# Patient Record
Sex: Female | Born: 1960 | Race: White | Hispanic: No | Smoking: Former smoker
Health system: Southern US, Community
[De-identification: ages and names within clinical notes are randomized; demographics above are authoritative.]

## PROBLEM LIST (undated history)

## (undated) DIAGNOSIS — M51369 Other intervertebral disc degeneration, lumbar region without mention of lumbar back pain or lower extremity pain: Secondary | ICD-10-CM

## (undated) DIAGNOSIS — C801 Malignant (primary) neoplasm, unspecified: Secondary | ICD-10-CM

## (undated) DIAGNOSIS — M5136 Other intervertebral disc degeneration, lumbar region: Secondary | ICD-10-CM

## (undated) DIAGNOSIS — M1990 Unspecified osteoarthritis, unspecified site: Secondary | ICD-10-CM

## (undated) HISTORY — DX: Other intervertebral disc degeneration, lumbar region without mention of lumbar back pain or lower extremity pain: M51.369

## (undated) HISTORY — DX: Unspecified osteoarthritis, unspecified site: M19.90

## (undated) HISTORY — DX: Malignant (primary) neoplasm, unspecified: C80.1

## (undated) HISTORY — PX: ABDOMINAL HYSTERECTOMY: SHX81

## (undated) HISTORY — PX: APPENDECTOMY: SHX54

## (undated) HISTORY — PX: HIP SURGERY: SHX245

## (undated) HISTORY — DX: Other intervertebral disc degeneration, lumbar region: M51.36

---

## 2013-09-08 HISTORY — PX: BACK SURGERY: SHX140

## 2018-07-16 ENCOUNTER — Ambulatory Visit (INDEPENDENT_AMBULATORY_CARE_PROVIDER_SITE_OTHER): Payer: Medicare HMO | Admitting: Orthopaedic Surgery

## 2018-07-16 ENCOUNTER — Encounter (INDEPENDENT_AMBULATORY_CARE_PROVIDER_SITE_OTHER): Payer: Self-pay | Admitting: Orthopaedic Surgery

## 2018-07-16 ENCOUNTER — Ambulatory Visit (INDEPENDENT_AMBULATORY_CARE_PROVIDER_SITE_OTHER): Payer: Medicare HMO

## 2018-07-16 VITALS — BP 163/102 | HR 69

## 2018-07-16 DIAGNOSIS — G8929 Other chronic pain: Secondary | ICD-10-CM | POA: Diagnosis not present

## 2018-07-16 DIAGNOSIS — M25511 Pain in right shoulder: Secondary | ICD-10-CM | POA: Diagnosis not present
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
## 2018-07-19 ENCOUNTER — Ambulatory Visit (INDEPENDENT_AMBULATORY_CARE_PROVIDER_SITE_OTHER): Payer: Self-pay | Admitting: Orthopaedic Surgery

## 2018-07-20 ENCOUNTER — Telehealth (INDEPENDENT_AMBULATORY_CARE_PROVIDER_SITE_OTHER): Payer: Self-pay | Admitting: Orthopaedic Surgery

## 2018-07-20 ENCOUNTER — Other Ambulatory Visit: Payer: Self-pay | Admitting: Radiology

## 2018-07-20 MED ORDER — DIAZEPAM 5 MG PO TABS: ORAL_TABLET | ORAL | 0 refills | Status: DC

## 2018-07-20 NOTE — Telephone Encounter (Signed)
Valium 5mg  1 tab po prior to scan

## 2018-07-20 NOTE — Telephone Encounter (Signed)
Please advise 

## 2018-07-20 NOTE — Telephone Encounter (Signed)
Patient requesting a rx for medication to help her get thru MRI, even if its open. Patient is very claustrophobia. [facility] [address].

## 2018-07-20 NOTE — Telephone Encounter (Signed)
CALLED [facility] INTO PH[facility]Y AND NOTIFIED PT

## 2018-07-29 ENCOUNTER — Ambulatory Visit
Admission: RE | Admit: 2018-07-29 | Discharge: 2018-07-29 | Disposition: A | Discharge status: Home / Self Care | Payer: Medicare HMO | Source: Ambulatory Visit | Attending: Orthopaedic Surgery | Admitting: Orthopaedic Surgery

## 2018-07-29 DIAGNOSIS — G8929 Other chronic pain: Secondary | ICD-10-CM

## 2018-07-29 DIAGNOSIS — M25511 Pain in right shoulder: Principal | ICD-10-CM

## 2018-07-29 MED ORDER — IOPAMIDOL (ISOVUE-M 200) INJECTION 41%
15.0000 mL | Freq: Once | INTRAMUSCULAR | Status: AC
  Administered 2018-07-29: 15 mL via INTRA_ARTICULAR

## 2018-07-30 ENCOUNTER — Ambulatory Visit (INDEPENDENT_AMBULATORY_CARE_PROVIDER_SITE_OTHER): Payer: Medicare HMO | Admitting: Orthopaedic Surgery

## 2018-08-03 ENCOUNTER — Other Ambulatory Visit (INDEPENDENT_AMBULATORY_CARE_PROVIDER_SITE_OTHER): Payer: Self-pay | Admitting: *Deleted

## 2018-08-03 ENCOUNTER — Ambulatory Visit (INDEPENDENT_AMBULATORY_CARE_PROVIDER_SITE_OTHER): Payer: Medicare HMO | Admitting: Orthopaedic Surgery

## 2018-08-03 ENCOUNTER — Encounter (INDEPENDENT_AMBULATORY_CARE_PROVIDER_SITE_OTHER): Payer: Self-pay | Admitting: Orthopaedic Surgery

## 2018-08-03 VITALS — BP 125/87 | HR 87 | Resp 16 | Ht 62.0 in | Wt 240.0 lb

## 2018-08-03 DIAGNOSIS — G8929 Other chronic pain: Secondary | ICD-10-CM

## 2018-08-03 DIAGNOSIS — M25511 Pain in right shoulder: Secondary | ICD-10-CM | POA: Diagnosis not present

## 2018-08-03 NOTE — Progress Notes (Signed)
Office Visit Note   Patient: Shannon Delacruz           Date of Birth: April 05, 1961           MRN: [id number] Visit Date: 08/03/2018              Requested by: Neale Burly, [place] [place] [address] [place], [place] [address] PCP: Neale Burly, MD   Assessment & Plan: Visit Diagnoses:  1. Chronic right shoulder pain     Plan: MRI scan demonstrates moderate supraspinatus and infraspinatus tendinosis with a focal full-thickness tear of the distal supraspinatus.  No focal muscular atrophy.  Biceps intact but with moderate intra-articular tendinosis.  Normal AC joint.  Acromium is type I.  Possible [place] complex.  There is a possible tear of the superior.  Long discussion  regarding the above.  Shannon Delacruz is going out of town for almost a month and would like to try physical therapy before considering surgery.  We will schedule that to see her back in January.  Discussed surgery to include an arthroscopic SCD debride the joint possible biceps tenodesis and rotator cuff tear repair  Follow-Up Instructions: Return in about 1 month (around 09/02/2018).   Orders:  No orders of the defined types were placed in this encounter.  No orders of the defined types were placed in this encounter.     Procedures: No procedures performed   Clinical Data: No additional findings.   Subjective: Chief Complaint  Patient presents with  . Right Shoulder - Follow-up  . Results    MRI results  Symptoms have not changed.  Still having pain with overhead motion and even some referred pain into the biceps.  MRI scan findings as above  HPI  Review of Systems  Constitutional: Positive for fatigue.  HENT: Positive for trouble swallowing.   Eyes: Negative for pain.  Respiratory: Negative for shortness of breath.   Cardiovascular: Positive for leg swelling.  Gastrointestinal: Negative for constipation.  Endocrine: Positive for heat intolerance.  Genitourinary: Negative for difficulty  urinating.  Musculoskeletal: Positive for joint swelling, neck pain and neck stiffness.  Skin: Negative for rash.  Allergic/Immunologic: Negative for food allergies.  Neurological: Positive for numbness.  Hematological: Does not bruise/bleed easily.  Psychiatric/Behavioral: Positive for sleep disturbance.     Objective: Vital Signs: BP 125/87 (BP Location: Left Arm, Patient Position: Sitting, Cuff Size: Normal)   Pulse 87   Resp 16   Ht 5\' 2"  (1.575 m)   Wt 240 lb (108.9 kg)   BMI 43.90 kg/m   Physical Exam  Constitutional: She is oriented to person, place, and time. She appears well-developed and well-nourished.  HENT:  Mouth/Throat: Oropharynx is clear and moist.  Eyes: Pupils are equal, round, and reactive to light. EOM are normal.  Pulmonary/Chest: Effort normal.  Neurological: She is alert and oriented to person, place, and time.  Skin: Skin is warm and dry.  Psychiatric: She has a normal mood and affect. Her behavior is normal.    Ortho Exam awake alert and oriented x3.  Comfortable sitting.  Right shoulder is a little tight at the extreme of internal rotation and flexion but I could not place her arm fully overhead passively.  Positive speeds sign.  Mildly positive impingement testing and empty can.  Good strength.  Skin intact.  Some tenderness over the anterior aspect of the shoulder and the biceps tendon  Specialty Comments:  No specialty comments available.  Imaging: No results found.   [place]  History: There are no active problems to display for this patient.  Past Medical History:  Diagnosis Date  . Cancer ([place])    UTERINE  . DDD (degenerative disc disease), lumbar   . Osteoarthritis     History reviewed. No pertinent family history.  Past Surgical History:  Procedure Laterality Date  . ABDOMINAL HYSTERECTOMY    . APPENDECTOMY    . BACK SURGERY  2015  . HIP SURGERY     Social History   Occupational History  . Not on file  Tobacco Use  . Smoking  status: Former Smoker    Packs/day: 1.00    Years: 10.00    Pack years: 10.00    Types: Cigarettes    Last attempt to quit: 08/03/1993    Years since quitting: 25.0  . Smokeless tobacco: Never Used  Substance and Sexual Activity  . Alcohol use: Yes    Comment: OCC  . Drug use: Not Currently  . Sexual activity: Not on file

## 2018-08-10 ENCOUNTER — Other Ambulatory Visit (INDEPENDENT_AMBULATORY_CARE_PROVIDER_SITE_OTHER): Payer: Self-pay | Admitting: *Deleted

## 2018-08-10 ENCOUNTER — Telehealth (INDEPENDENT_AMBULATORY_CARE_PROVIDER_SITE_OTHER): Payer: Self-pay | Admitting: Orthopaedic Surgery

## 2018-08-10 MED ORDER — DICLOFENAC SODIUM 1 % TD GEL: TRANSDERMAL | 4 refills | Status: DC

## 2018-08-10 NOTE — Telephone Encounter (Signed)
Patient states at her last office visit with Dr. Durward Fortes on 08/03/18 he discussed Voltaren Gel.  Patient states she would [facility] [address] in [place].

## 2018-08-10 NOTE — Telephone Encounter (Signed)
OK to prescribe, do not anything in last note. Thank you.

## 2018-08-10 NOTE — Telephone Encounter (Signed)
OK to prescribe, do not see anything in last note

## 2018-08-10 NOTE — Telephone Encounter (Signed)
ok 

## 2018-08-17 ENCOUNTER — Ambulatory Visit: Payer: Medicare HMO | Attending: Orthopaedic Surgery | Admitting: Physical Therapy

## 2018-08-17 ENCOUNTER — Encounter: Payer: Self-pay | Admitting: Physical Therapy

## 2018-08-17 DIAGNOSIS — M6281 Muscle weakness (generalized): Secondary | ICD-10-CM | POA: Diagnosis present

## 2018-08-17 DIAGNOSIS — G8929 Other chronic pain: Secondary | ICD-10-CM

## 2018-08-17 DIAGNOSIS — M25511 Pain in right shoulder: Secondary | ICD-10-CM | POA: Insufficient documentation

## 2018-08-17 NOTE — Therapy (Signed)
[facility] [address]  [place], [place], [address] Phone: [phone]   Fax:  [phone]  Physical Therapy Evaluation  Patient Details  Name: Ginelle Bays MRN: [id number] Date of Birth: 1960/10/08 Referring Provider (PT): Joni Fears MD   Encounter Date: 08/17/2018  PT End of Session - 08/17/18 1117    Visit Number  1    Number of Visits  16    Date for PT Re-Evaluation  10/12/18    Authorization Type  [employer] MCR and MCD    PT Start Time  1000    PT Stop Time  1100    PT Time Calculation (min)  60 min    Equipment Utilized During Treatment  Gait belt    Activity Tolerance  Patient tolerated treatment well    Behavior During Therapy  [facility] for tasks assessed/performed       Past Medical History:  Diagnosis Date  . Cancer ([place])    UTERINE  . DDD (degenerative disc disease), lumbar   . Osteoarthritis     Past Surgical History:  Procedure Laterality Date  . ABDOMINAL HYSTERECTOMY    . APPENDECTOMY    . BACK SURGERY  2015  . HIP SURGERY      There were no vitals filed for this visit.   Subjective Assessment - 08/17/18 1006    Subjective  Pt relays a few weeks ago she was working out shoulders in the gym and she felt her Rt shoulder go out of socket. She has had pain and popping in her shoulder ever since promping MRI showing "mod supra and infraspinatus teninosis with full thickness tear of supraspinatus. BIceps tendonosis, labral degeneration with probable tear". 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 She also relays now the Lt shoulder is also in pain. She relays MD is recommending surgery but she wants to try PT first. She will be out of town 12/17 through 09/14/17    Pertinent History  XLK:[id number] Cancer,lumbar DDD,OA, back surgery 2015, hip surgery    Limitations  Lifting;House hold activities    Diagnostic tests  MRI    Patient Stated Goals  learn what to do to try to heal my shoulder and avoid surgery     Currently in Pain?  Yes    Pain Score  3     Pain Location  Shoulder    Pain Orientation  Right    Pain Descriptors / Indicators  Aching;Tightness    Pain Type  --   subacute   Pain Radiating Towards  down Rt arm into hand    Pain Onset  More than a month ago    Pain Frequency  Intermittent    Aggravating Factors   reaching overhead or behind back, lifting, carrying     Pain Relieving Factors  ice, heat, oils, tumeric    Multiple Pain Sites  No         [facility] PT Assessment - 08/17/18 0001      Assessment   Medical Diagnosis  chronic Rt shoulder pain, tendinosis, RTC tear    Referring Provider (PT)  Joni Fears MD    Onset Date/Surgical Date  --   3-4 weeks    Hand Dominance  Right    Next MD Visit  not scheduled    Prior Therapy  in past after lumbar and hip surgery      Precautions   Precautions  None      Balance Screen  Has the patient fallen in the past 6 months  No      Home Environment   Living Environment  Private residence    Additional Comments  has to get help with cleaning but does everything else independetly      Prior Function   Level of Independence  Independent with basic ADLs    Vocation  On disability      Cognition   Overall Cognitive Status  Within Functional Limits for tasks assessed      Observation/Other Assessments   Focus on Therapeutic Outcomes (FOTO)   47% limited      Sensation   Light Touch  Appears Intact      Posture/Postural Control   Posture Comments  rounded shoulders      ROM / Strength   AROM / PROM / Strength  AROM;Strength      AROM   AROM Assessment Site  Shoulder    Right/Left Shoulder  Right    Right Shoulder Flexion  120 Degrees    Right Shoulder ABduction  100 Degrees    Right Shoulder Internal Rotation  --   WFL and equal to Lt   Right Shoulder External Rotation  --   WFL and equal to Lt     Strength   Overall Strength Comments  Rt elbow 4+/5 MMT    Strength Assessment Site  Shoulder    Right/Left  Shoulder  Right    Right Shoulder Flexion  4/5    Right Shoulder ABduction  3-/5    Right Shoulder Internal Rotation  4+/5    Right Shoulder External Rotation  4/5      Palpation   Palpation comment  TTP Rt UT,supra, infra, RTC insertion,biceps tendon      Special Tests   Other special tests  pain with belly press test, and lift off test, she can hold arm up for drop arm test                Objective measurements completed on examination: See above findings.      [facility] Adult PT Treatment/Exercise - 08/17/18 0001      Exercises   Exercises  Shoulder      Shoulder Exercises: Supine   Protraction Limitations  next visit      Shoulder Exercises: Sidelying   ABduction  Right;10 reps      Shoulder Exercises: Standing   Horizontal ABduction  10 reps;Both    Theraband Level (Shoulder Horizontal ABduction)  Level 2 (Red)    External Rotation  Strengthening;Right;10 reps    Theraband Level (Shoulder External Rotation)  Level 2 (Red)    Internal Rotation  Strengthening;Right;10 reps    Theraband Level (Shoulder Internal Rotation)  Level 2 (Red)    ABduction Limitations  5 reps AAROM wall slides    Extension  Strengthening;Both;10 reps;Theraband    Theraband Level (Shoulder Extension)  Level 2 (Red)    Row  Strengthening;Both;15 reps    Theraband Level (Shoulder Row)  Level 2 (Red)    Other Standing Exercises  bicep curl eccentric with red Tband X 5 pronated, X 5 supinated      Modalities   Modalities  Moist Heat      Moist Heat Therapy   Number Minutes Moist Heat  5 Minutes    Moist Heat Location  Shoulder             PT Education - 08/17/18 1117    Education Details  HEP,POC,heat,ice  Person(s) Educated  Patient    Methods  Explanation;Demonstration;Verbal cues;Handout    Comprehension  Verbalized understanding;Returned demonstration       PT Short Term Goals - 08/17/18 1126      PT SHORT TERM GOAL #1   Title  Pt will be I and compliant with HEP.  4 weeks 09/16/17    Status  New        PT Long Term Goals - 08/17/18 1126      PT LONG TERM GOAL #1   Title  Pt will improve FOTO to less than 37% limited. 8 weeks 10/12/18    Status  New      PT LONG TERM GOAL #2   Title  Pt will improve Rt shoulder AROM flexion to 130 deg and abd to 120 deg to be [facility]. 8 weeks 10/12/18    Status  New      PT LONG TERM GOAL #3   Title  Pt will improve Rt shoulder strength to 4 to 4+/5 overall. 8 weeks 10/12/18    Status  New      PT LONG TERM GOAL #4   Title  Pt will be able to reach behind her back and to lift carry groceries with less than 1-2/10 pain. 8 weeks 10/12/18    Status  New             Plan - 08/17/18 1119    Clinical Impression Statement  Pt presents with acute on chronic Rt shoulder pain and weakness with tendinosis, RTC tear, and probable labral tear found on MRI. Pt has managed her pain at home with supplements and she does not want to have surgery. Her biggest deficits are with shoulder weakness and instability  with popping. Mobility is good but she has limited overhead reaching ability and decreased lifitng/carrying ability due to weakness and pain. She will benefit from skilled PT to address her deficits.     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    Clinical Decision Making  Moderate    Rehab Potential  Fair    Clinical Impairments Affecting Rehab Potential  pt will be out of town for about 3-4 weeks. She was given thorough HEP as a result    PT Frequency  2x / week    PT Duration  8 weeks    PT Treatment/Interventions  Cryotherapy;Electrical Stimulation;Iontophoresis 4mg /ml Dexamethasone;Moist Heat;Ultrasound;Therapeutic activities;Therapeutic  exercise;Neuromuscular re-education;Manual techniques;Passive range of motion;Dry needling;Taping    PT Next Visit Plan  shoulder stabilty and strength. She requested [place]     Consulted and Agree with Plan of Care  Patient       Patient will benefit from skilled therapeutic intervention in order to improve the following deficits and impairments:  Decreased activity tolerance, Decreased endurance, Decreased range of motion, Decreased strength, Pain, Postural dysfunction  Visit Diagnosis: Chronic right shoulder pain  Muscle weakness (generalized)     Problem List There are no active problems to display for this patient.   Silvestre Mesi 08/17/2018, 11:31 AM  [facility] [address]  [place], [place], [address] Phone: [phone]   Fax:  [phone]  Name: Myria Steenbergen MRN: [id number] Date of Birth: [birth year]

## 2018-08-17 NOTE — Patient Instructions (Signed)
Access Code: [email or web address]  URL: [email or web address]  Date: 08/17/2018  Prepared by: Elsie Ra   Exercises  Shoulder Extension with Resistance - 10 reps - 3 sets - 1x daily - 7x weekly  Standing Shoulder Row with Anchored Resistance - 10 reps - 3 sets - 5 hold - 1x daily - 7x weekly  Standing Shoulder Horizontal Abduction with Resistance - 10 reps - 3 sets - 5 hold - 1x daily - 7x weekly  Shoulder Internal Rotation with Resistance - 10 reps - 3 sets - 2x daily - 6x weekly  Shoulder External Rotation with Anchored Resistance - 10 reps - 3 sets - 2x daily - 6x weekly  Sidelying Shoulder Abduction - 10 reps - 3 sets - 2x daily - 6x weekly  Standing Eccentric Bicep Curl Pronated then Supinated - 10 reps - 3 sets - 2x daily - 6x weekly  Standing Shoulder Abduction Slides at Wall - 10 reps - 1-2 sets - 2x daily - 6x weekly

## 2018-08-18 ENCOUNTER — Ambulatory Visit: Payer: Medicare HMO | Admitting: Physical Therapy

## 2018-08-18 DIAGNOSIS — M25511 Pain in right shoulder: Secondary | ICD-10-CM | POA: Diagnosis not present

## 2018-08-18 DIAGNOSIS — G8929 Other chronic pain: Secondary | ICD-10-CM

## 2018-08-18 DIAGNOSIS — M6281 Muscle weakness (generalized): Secondary | ICD-10-CM

## 2018-08-18 NOTE — Therapy (Signed)
[facility] [address]  [place], [place], [address] Phone: [phone]   Fax:  [phone]  Physical Therapy Treatment  Patient Details  Name: Shannon Delacruz MRN: [id number] Date of Birth: February 22, 1961 Referring Provider (PT): Joni Fears MD   Encounter Date: 08/18/2018  PT End of Session - 08/18/18 1624    Visit Number  2    Number of Visits  16    Date for PT Re-Evaluation  10/12/18    Authorization Type  [employer] MCR and MCD    PT Start Time  8588    PT Stop Time  1617    PT Time Calculation (min)  47 min    Activity Tolerance  Patient tolerated treatment well    Behavior During Therapy  [facility] for tasks assessed/performed       Past Medical History:  Diagnosis Date  . Cancer ([place])    UTERINE  . DDD (degenerative disc disease), lumbar   . Osteoarthritis     Past Surgical History:  Procedure Laterality Date  . ABDOMINAL HYSTERECTOMY    . APPENDECTOMY    . BACK SURGERY  2015  . HIP SURGERY      There were no vitals filed for this visit.  Subjective Assessment - 08/18/18 1607    Subjective  Pt relays her shoulder is sore from doing the exercise. She relays more pain in her Lt shoulder also and says she will have MD take a look at it.    Currently in Pain?  Yes    Pain Score  4     Pain Location  Shoulder    Pain Orientation  Right    Pain Descriptors / Indicators  Sore                       [facility] Adult PT Treatment/Exercise - 08/18/18 0001      Exercises   Exercises  Shoulder      Shoulder Exercises: Supine   Protraction Limitations Supine Flexion  2 lbs 2X10 bilat  1 lb 2X10     Shoulder Exercises: Sidelying   External Rotation  Right    External Rotation Weight (lbs)  1    External Rotation Limitations  2X10    ABduction  Right    ABduction Weight (lbs)  1    ABduction Limitations  2X10      Shoulder Exercises: Standing   Horizontal ABduction  Both;20 reps    Theraband Level (Shoulder Horizontal ABduction)  Level 2 (Red)    External Rotation  Strengthening;Both;20 reps    Theraband Level (Shoulder External Rotation)  Level 2 (Red)    External Rotation Weight (lbs)  bilat ER at same time/low trap     Other Standing Exercises  wall push ups 2X10, ball circles on wall for stabilization red weighted ball 90 deg flex for circles, up/down, lateral X 20 ea      Shoulder Exercises: Pulleys   Flexion  2 minutes    ABduction  2 minutes      Modalities   Modalities  Ultrasound      Ultrasound   Ultrasound Location  Rt shoulder anterior 7 min, posterior 7 min    Ultrasound Parameters  50%, 1.0 w/cm2, 3.3. mhz    Ultrasound Goals  Pain             PT Education - 08/18/18 1624    Education Details  HEP review  Person(s) Educated  Patient    Methods  Explanation;Demonstration;Verbal cues    Comprehension  Verbalized understanding;Returned demonstration       PT Short Term Goals - 08/17/18 1126      PT SHORT TERM GOAL #1   Title  Pt will be I and compliant with HEP. 4 weeks 09/16/17    Status  New        PT Long Term Goals - 08/17/18 1126      PT LONG TERM GOAL #1   Title  Pt will improve FOTO to less than 37% limited. 8 weeks 10/12/18    Status  New      PT LONG TERM GOAL #2   Title  Pt will improve Rt shoulder AROM flexion to 130 deg and abd to 120 deg to be [facility]. 8 weeks 10/12/18    Status  New      PT LONG TERM GOAL #3   Title  Pt will improve Rt shoulder strength to 4 to 4+/5 overall. 8 weeks 10/12/18    Status  New      PT LONG TERM GOAL #4   Title  Pt will be able to reach behind her back and to lift carry groceries with less than 1-2/10 pain. 8 weeks 10/12/18    Status  New            Plan - 08/18/18 1625    Clinical Impression Statement  Pt trialed with U.S to reduce pain and inflammation per pt request. This was followed by light stretching and then majority of session focused on shoulder strengthening and  stabilization to tolerance. She will miss the next few weeks traveling and will return home 09/14/17. She was encouraged to bring her T-band with her to perform her HEP.     Rehab Potential  Fair    Clinical Impairments Affecting Rehab Potential  pt will be out of town for about 3-4 weeks. She was given thorough HEP as a result    PT Frequency  2x / week    PT Duration  8 weeks    PT Treatment/Interventions  Cryotherapy;Electrical Stimulation;Iontophoresis 4mg /ml Dexamethasone;Moist Heat;Ultrasound;Therapeutic activities;Therapeutic exercise;Neuromuscular re-education;Manual techniques;Passive range of motion;Dry needling;Taping    PT Next Visit Plan  shoulder stabilty and strength. She requested U.S. so assess this response    Consulted and Agree with Plan of Care  Patient       Patient will benefit from skilled therapeutic intervention in order to improve the following deficits and impairments:  Decreased activity tolerance, Decreased endurance, Decreased range of motion, Decreased strength, Pain, Postural dysfunction  Visit Diagnosis: Chronic right shoulder pain  Muscle weakness (generalized)     Problem List There are no active problems to display for this patient.   Debbe Odea, PT,DPT 08/18/2018, 4:28 PM  [facility] [address]  [place], [place], [address] Phone: [phone]   Fax:  [phone]  Name: Yesly Gerety MRN: [id number] Date of Birth: 03-29-1961

## 2018-09-16 ENCOUNTER — Encounter: Payer: Self-pay | Admitting: Physical Therapy

## 2018-09-16 ENCOUNTER — Ambulatory Visit: Payer: Medicare Other | Attending: Orthopaedic Surgery | Admitting: Physical Therapy

## 2018-09-16 DIAGNOSIS — R29898 Other symptoms and signs involving the musculoskeletal system: Secondary | ICD-10-CM | POA: Diagnosis present

## 2018-09-16 DIAGNOSIS — M6281 Muscle weakness (generalized): Secondary | ICD-10-CM

## 2018-09-16 DIAGNOSIS — G8929 Other chronic pain: Secondary | ICD-10-CM | POA: Insufficient documentation

## 2018-09-16 DIAGNOSIS — M25512 Pain in left shoulder: Secondary | ICD-10-CM | POA: Diagnosis present

## 2018-09-16 DIAGNOSIS — M25611 Stiffness of right shoulder, not elsewhere classified: Secondary | ICD-10-CM | POA: Insufficient documentation

## 2018-09-16 DIAGNOSIS — M25511 Pain in right shoulder: Secondary | ICD-10-CM | POA: Insufficient documentation

## 2018-09-16 DIAGNOSIS — M25612 Stiffness of left shoulder, not elsewhere classified: Secondary | ICD-10-CM | POA: Insufficient documentation

## 2018-09-16 NOTE — Therapy (Signed)
[facility] [address]  [place], [place], [address] Phone: [phone]   Fax:  [phone]  Physical Therapy Treatment  Patient Details  Name: Shannon Delacruz MRN: [id number] Date of Birth: 1961-03-16 Referring Provider (PT): Joni Fears MD   Encounter Date: 09/16/2018  PT End of Session - 09/16/18 1651    Visit Number  3    Number of Visits  16    Date for PT Re-Evaluation  10/12/18    Authorization Type  [employer] MCR and MCD    PT Start Time  3536    PT Stop Time  1701    PT Time Calculation (min)  52 min    Activity Tolerance  Patient tolerated treatment well;Patient limited by pain    Behavior During Therapy  [facility] for tasks assessed/performed       Past Medical History:  Diagnosis Date  . Cancer ([place])    UTERINE  . DDD (degenerative disc disease), lumbar   . Osteoarthritis     Past Surgical History:  Procedure Laterality Date  . ABDOMINAL HYSTERECTOMY    . APPENDECTOMY    . BACK SURGERY  2015  . HIP SURGERY      There were no vitals filed for this visit.  Subjective Assessment - 09/16/18 1610    Subjective  Patient reporting that R shoulder is feeling better, L shoulder now bothering her. Stopped doing banded exercises on L d/t this pain. Has notified MD about this pain.     Pertinent History  [id number] Cancer,lumbar DDD,OA, back surgery 2015, hip surgery    Diagnostic tests  MRI    Patient Stated Goals  learn what to do to try to heal my shoulder and avoid surgery    Currently in Pain?  Yes    Pain Score  3     Pain Location  Shoulder    Pain Orientation  Right    Pain Descriptors / Indicators  Sharp;Aching    Multiple Pain Sites  Yes    Pain Score  8    Pain Location  Shoulder    Pain Orientation  Left    Pain Descriptors / Indicators  Dull                       [facility] Adult PT Treatment/Exercise - 09/16/18 0001      Shoulder Exercises: Supine   External Rotation   AAROM;Right;Left;10 reps    External Rotation Limitations  wand to tolerance with cues to maintain elbows in at sides    Internal Rotation  AAROM;Right;Left;10 reps    Internal Rotation Limitations  wand to tolerance with cues to maintain elbows in at sides    Flexion  AAROM;Both;10 reps    Flexion Limitations  with wand to tolerance    ABduction  AAROM;Left;10 reps    ABduction Limitations  taking weight of arm with patient performing movement to tolerance      Shoulder Exercises: Seated   Retraction  Strengthening;Both;10 reps    Retraction Limitations  10x3"      Shoulder Exercises: Pulleys   Flexion  3 minutes    Flexion Limitations  to tolerance    Scaption  3 minutes    Scaption Limitations  to tolerance      Shoulder Exercises: Isometric Strengthening   Flexion  5X5"    Flexion Limitations  B UEs with 30% effort    External Rotation  5X5"  External Rotation Limitations  B UEs with 30% effort    Internal Rotation  5X5"    Internal Rotation Limitations  B UEs with 30% effort    ABduction  5X5"    ABduction Limitations  B UEs with 30% effort      Moist Heat Therapy   Number Minutes Moist Heat  10 Minutes    Moist Heat Location  Shoulder   B     Manual Therapy   Manual Therapy  Soft tissue mobilization    Soft tissue mobilization  gentle STM to B proximal biceps tendon and muscle belly- worse L                PT Short Term Goals - 09/16/18 1653      PT SHORT TERM GOAL #1   Title  Pt will be I and compliant with HEP. 4 weeks 09/16/17    Status  On-going   unable to continue d/t pain       PT Long Term Goals - 09/16/18 1653      PT LONG TERM GOAL #1   Title  Pt will improve FOTO to less than 37% limited. 8 weeks 10/12/18    Status  On-going      PT LONG TERM GOAL #2   Title  Pt will improve Rt shoulder AROM flexion to 130 deg and abd to 120 deg to be [facility]. 8 weeks 10/12/18    Status  On-going   NT d/t severe pain     PT LONG TERM GOAL #3   Title  Pt  will improve Rt shoulder strength to 4 to 4+/5 overall. 8 weeks 10/12/18    Status  On-going   NT d/t severe pain     PT LONG TERM GOAL #4   Title  Pt will be able to reach behind her back and to lift carry groceries with less than 1-2/10 pain. 8 weeks 10/12/18    Status  On-going   reporting consisderable pain with this activity           Plan - 09/16/18 1652    Clinical Impression Statement  Patient arrived to session with report of significant L shoulder pain to the point that she has discontinued her banded HEP. Pain is worse with IR ROM. R shoulder pain is improved. Reports that she has notified MD of this pain and wanting to F/U with him. Patient extremely TTP in B proximal biceps tendon and biceps muscle belly. Worked on AAROM to patient's tolerance on B shoulders- patient very tender with these activities and considerably limited in ROM. Able to tolerate gentle shoulder isometrics for strengthening at 30% of maximal effort. Ended session with moist heat to B shoulders per patient's request as she reports ice bothers her. Patient to plan to see MD before scheduling additional PT visits.     Clinical Impairments Affecting Rehab Potential  pt will be out of town for about 3-4 weeks. She was given thorough HEP as a result    PT Treatment/Interventions  Cryotherapy;Electrical Stimulation;Iontophoresis 4mg /ml Dexamethasone;Moist Heat;Ultrasound;Therapeutic activities;Therapeutic exercise;Neuromuscular re-education;Manual techniques;Passive range of motion;Dry needling;Taping    PT Next Visit Plan  awaiting MD's report on L shoulder before continuing with PT    Consulted and Agree with Plan of Care  Patient       Patient will benefit from skilled therapeutic intervention in order to improve the following deficits and impairments:  Decreased activity tolerance, Decreased endurance, Decreased range of motion, Decreased strength, Pain,  Postural dysfunction  Visit Diagnosis: Chronic right  shoulder pain  Muscle weakness (generalized)     Problem List There are no active problems to display for this patient.   Janene Harvey, PT, DPT 09/16/18 5:04 PM    [facility] [address]  [place], [place], [address] Phone: [phone]   Fax:  [phone]  Name: Shannon Delacruz MRN: [id number] Date of Birth: [birth year]

## 2018-09-20 ENCOUNTER — Other Ambulatory Visit (INDEPENDENT_AMBULATORY_CARE_PROVIDER_SITE_OTHER): Payer: Self-pay | Admitting: *Deleted

## 2018-09-20 ENCOUNTER — Encounter (INDEPENDENT_AMBULATORY_CARE_PROVIDER_SITE_OTHER): Payer: Self-pay | Admitting: Orthopaedic Surgery

## 2018-09-20 ENCOUNTER — Ambulatory Visit (INDEPENDENT_AMBULATORY_CARE_PROVIDER_SITE_OTHER): Payer: Medicare Other | Admitting: Orthopaedic Surgery

## 2018-09-20 ENCOUNTER — Ambulatory Visit (INDEPENDENT_AMBULATORY_CARE_PROVIDER_SITE_OTHER): Payer: Medicare Other

## 2018-09-20 VITALS — BP 141/106 | HR 72 | Wt 240.0 lb

## 2018-09-20 DIAGNOSIS — M7502 Adhesive capsulitis of left shoulder: Secondary | ICD-10-CM | POA: Diagnosis not present

## 2018-09-20 DIAGNOSIS — M25512 Pain in left shoulder: Principal | ICD-10-CM

## 2018-09-20 DIAGNOSIS — G8929 Other chronic pain: Secondary | ICD-10-CM

## 2018-09-20 NOTE — Progress Notes (Signed)
Office Visit Note   Patient: Shannon Delacruz           Date of Birth: [birth year]           MRN: [id number] Visit Date: 09/20/2018              Requested by: Neale Burly, [place], [place] [address] PCP: Neale Burly, MD   Assessment & Plan: Visit Diagnoses:  1. Adhesive capsulitis of left shoulder     Plan: Left shoulder pain with recent onset could be combination of factors creating the pain.  Very minimal adhesive capsulitis and positive impingement.  Long discussion regarding treatment options.  Should continue with the physical therapy in [place] at the [facility] med center and use Voltaren gel.  Continue with therapy for the right shoulder re-follow-up in the next 4 to 6 weeks if still a problem  Follow-Up Instructions: Return if symptoms worsen or fail to improve.   Orders:  Orders Placed This Encounter  Procedures  . XR Shoulder Left   No orders of the defined types were placed in this encounter.     Procedures: No procedures performed   Clinical Data: No additional findings.   Subjective: Chief Complaint  Patient presents with  . Right Elbow - Follow-up  . Follow-up    rt shoulder doing ok but now Lt shoulder--having sharp pain  Right shoulder pain has improved significantly with a combination of time and physical therapy.  Had recent onset of left shoulder pain while working with the bands as part of her right shoulder surgery.  No numbness or tingling.  Pain is localized to the left shoulder particularly with overhead activity.  Having  difficulty reaching behind her back.  HPI  Review of Systems   Objective: Vital Signs: BP (!) 141/106 (BP Location: Right Arm, Patient Position: Sitting, Cuff Size: Normal)   Pulse 72   Wt 240 lb (108.9 kg)   BMI 43.90 kg/m   Physical Exam Constitutional:      Appearance: She is well-developed.  Eyes:     Pupils: Pupils are equal, round, and reactive to light.  Pulmonary:   Effort: Pulmonary effort is normal.  Skin:    General: Skin is warm and dry.  Neurological:     Mental Status: She is alert and oriented to person, place, and time.  Psychiatric:        Behavior: Behavior normal.     Ortho Exam left shoulder with slow overhead motion and positive impingement.  Able to place her arm just about fully overhead but does have some loss of internal rotation.  Mild anterior subacromial pain.  Skin intact neurovascular exam intact.  Biceps appears to be intact.  No ecchymosis.  No pain with range of motion of cervical spine Specialty Comments:  No specialty comments available.  Imaging: Xr Shoulder Left  Result Date: 09/20/2018 Films of the left shoulder obtained in several projections.  There is a little subchondral sclerosis with small subchondral cysts in the superior humeral head.  No acute changes.  No ectopic calcification.  Humeral head is centered about the glenoid.  Degenerative changes at the chromic clavicular joint.  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  Past Medical History:  Diagnosis Date  . Cancer ([place])    UTERINE  . DDD (degenerative disc disease), lumbar   . Osteoarthritis     History reviewed. No pertinent  family history.  Past Surgical History:  Procedure Laterality Date  . ABDOMINAL HYSTERECTOMY    . APPENDECTOMY    . BACK SURGERY  2015  . HIP SURGERY     Social History   Occupational History  . Not on file  Tobacco Use  . Smoking status: Former Smoker    Packs/day: 1.00    Years: 10.00    Pack years: 10.00    Types: Cigarettes    Last attempt to quit: 08/03/1993    Years since quitting: 25.1  . Smokeless tobacco: Never Used  Substance and Sexual Activity  . Alcohol use: Yes    Comment: OCC  . Drug use: Not Currently  . Sexual activity: Not on file     Garald Balding, MD   Note - This record has been created using [employer].  Chart  creation errors have been sought, but may not always  have been located. Such creation errors do not reflect on  the standard of medical care.

## 2018-09-23 ENCOUNTER — Encounter: Payer: Self-pay | Admitting: Physical Therapy

## 2018-09-23 ENCOUNTER — Ambulatory Visit: Payer: Medicare Other | Admitting: Physical Therapy

## 2018-09-23 DIAGNOSIS — R29898 Other symptoms and signs involving the musculoskeletal system: Secondary | ICD-10-CM

## 2018-09-23 DIAGNOSIS — M25612 Stiffness of left shoulder, not elsewhere classified: Secondary | ICD-10-CM

## 2018-09-23 DIAGNOSIS — M25511 Pain in right shoulder: Secondary | ICD-10-CM | POA: Diagnosis not present

## 2018-09-23 DIAGNOSIS — G8929 Other chronic pain: Secondary | ICD-10-CM

## 2018-09-23 DIAGNOSIS — M25611 Stiffness of right shoulder, not elsewhere classified: Secondary | ICD-10-CM

## 2018-09-23 DIAGNOSIS — M6281 Muscle weakness (generalized): Secondary | ICD-10-CM

## 2018-09-23 DIAGNOSIS — M25512 Pain in left shoulder: Secondary | ICD-10-CM

## 2018-09-23 NOTE — Therapy (Signed)
[facility] [address]  [place], [place], [address] Phone: [phone]   Fax:  [phone]  Physical Therapy Re-Evaluation  Patient Details  Name: Shannon Delacruz MRN: [id number] Date of Birth: 06-Apr-1961 Referring Provider (PT): Joni Fears MD   Encounter Date: 09/23/2018  PT End of Session - 09/23/18 1616    Visit Number  4    Number of Visits  16    Date for PT Re-Evaluation  11/04/18    Authorization Type  [employer] MCR     PT Start Time  6812    PT Stop Time  1613    PT Time Calculation (min)  43 min    Activity Tolerance  Patient tolerated treatment well;Patient limited by pain    Behavior During Therapy  [facility] for tasks assessed/performed       Past Medical History:  Diagnosis Date  . Cancer ([place])    UTERINE  . DDD (degenerative disc disease), lumbar   . Osteoarthritis     Past Surgical History:  Procedure Laterality Date  . ABDOMINAL HYSTERECTOMY    . APPENDECTOMY    . BACK SURGERY  2015  . HIP SURGERY      There were no vitals filed for this visit.   Subjective Assessment - 09/23/18 1531    Subjective  Patient returns to PT for re-evaluation of L shoulder pain. Reports that she saw MD who recommended MRI of L shoulder with conservative treatment until then. Reports that L shoulder pain first started when she picked up a light grocery bag in Dec 2019. Had immediate pain in L anterior upper arm as well as swelling. Pain is located over anterior bicep with radiation down to elbow. Reports that the pain continued when performing banded resistance exercises as part of her HEP for her R shoulder. Worse with reaching backwards, reaching overhead, lifting weight, reaching behind to touch back. Better with turmeric capsules and essential oils. Reports R shoulder is doing much better and has been compliant with HEP.    Pertinent History  [id number] Cancer,lumbar DDD,OA, back surgery 2015, hip surgery    Limitations  Lifting;House hold activities    Diagnostic tests  09/20/18 L shoulder xray: There is a little subchondral sclerosis with small subchondral cysts in the superior humeral head.  No acute changes.  No ectopic calcification.  Humeral head is centered about the glenoid.  Degenerative changes at the chromic clavicular joint.  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    Patient Stated Goals  learn what to do to try to heal my shoulder and avoid surgery    Pain Score  0-No pain    Pain Location  Shoulder    Pain Orientation  Right    Pain Type  Chronic pain    Pain Score  2    Pain Location  Shoulder    Pain Orientation  Left    Pain Descriptors / Indicators  Dull    Pain Type  Acute pain         [facility] PT Assessment - 09/23/18 0001      Assessment   Medical Diagnosis  Chronic L shoulder pain, Chronic R shoulder pain    Referring Provider (PT)  Joni Fears MD    Onset Date/Surgical Date  --   Dec 2019   Hand Dominance  Right    Prior Therapy  in past after lumbar and hip surgery      Precautions   Precautions  None  Balance Screen   Has the patient fallen in the past 6 months  No      [place] residence    Additional Comments  has to get help with cleaning but does everything else independetly      Prior Function   Level of Independence  Independent with basic ADLs    Vocation  On disability      Cognition   Overall Cognitive Status  Within Functional Limits for tasks assessed      Sensation   Light Touch  Appears Intact      Coordination   Gross Motor Movements are Fluid and Coordinated  Yes      Posture/Postural Control   Posture/Postural Control  Postural limitations    Postural Limitations  Rounded Shoulders;Forward head;Posterior pelvic tilt      AROM   Right/Left Shoulder  Right;Left    Right Shoulder Flexion  130 Degrees   feeling of apprehension   Right Shoulder ABduction  118 Degrees    Right Shoulder Internal  Rotation  --   FIR T10 with moderate pain   Right Shoulder External Rotation  --   FER T1    Left Shoulder Flexion  118 Degrees    Left Shoulder ABduction  89 Degrees   anterior shoulder pain   Left Shoulder Internal Rotation  --   FIR T 10 with moderate pain   Left Shoulder External Rotation  --   FER C7     Strength   Strength Assessment Site  Shoulder    Right/Left Shoulder  Right;Left    Right Shoulder Flexion  4/5    Right Shoulder ABduction  4/5   pain in anterior arm   Right Shoulder Internal Rotation  4/5    Right Shoulder External Rotation  4/5    Left Shoulder Flexion  4-/5   pain in anterior arm   Left Shoulder ABduction  3+/5   pain in anterior arm   Left Shoulder Internal Rotation  3+/5   pain   Left Shoulder External Rotation  4-/5   pain     Palpation   Palpation comment  TTP in B infraspinatus, proiximal biceps tendon, and biceps muscle belly                Objective measurements completed on examination: See above findings.              PT Education - 09/23/18 1616    Education Details  prognosis, POC, HEP    Person(s) Educated  Patient    Methods  Explanation;Demonstration;Tactile cues;Verbal cues;Handout    Comprehension  Verbalized understanding;Returned demonstration       PT Short Term Goals - 09/23/18 1622      PT SHORT TERM GOAL #1   Title  Pt will be I and compliant with initial HEP.    Time  3    Period  Weeks    Status  New    Target Date  10/14/18        PT Long Term Goals - 09/23/18 1623      PT LONG TERM GOAL #1   Title  Patient to be independent with advanced HEP.    Time  6    Period  Weeks    Status  New    Target Date  11/04/18      PT LONG TERM GOAL #2   Title  Pt will improve B shoulder AROM [facility]  and without pain limiting.     Time  6    Period  Weeks    Status  New    Target Date  11/04/18      PT LONG TERM GOAL #3   Title  Pt will improve B shoulder strength to 4+/5 overall.     Time  6     Period  Weeks    Status  New    Target Date  11/04/18      PT LONG TERM GOAL #4   Title  Pt will be able to reach behind her back and to lift carry groceries with less than 1-2/10 pain.     Time  6    Period  Weeks    Status  New    Target Date  11/04/18             Plan - 09/23/18 1617    Clinical Impression Statement  Patient returns to PT for re-evaluation of L shoulder pain with onset in Dec 2019 after lifting a light grocery bag. MRI showing sclerosis and subchondral cysts at humeral head. Pain is located over anterior bicep with radiation down to elbow. Denies N/T. Worse with reaching backwards, reaching overhead, lifting weight, reaching behind to touch back. Patient today with limited and painful B shoulder ROM, decreased shoulder strength- L>R, abnormal posture, and TTP in B infraspinatus, proximal biceps tendon, and proximal biceps muscle belly. Educated on gentle ROM HEP- patient reported understanding. Would benefit from skilled PT services 2x/week for 6 weeks to address aforementioned impairments.     Clinical Impairments Affecting Rehab Potential  OA, lumbar DDD, uterine CA, hip surgery, back surgery    PT Frequency  2x / week    PT Duration  6 weeks    PT Treatment/Interventions  Cryotherapy;Electrical Stimulation;Iontophoresis 4mg /ml Dexamethasone;Moist Heat;Ultrasound;Therapeutic activities;Therapeutic exercise;Neuromuscular re-education;Manual techniques;Passive range of motion;Dry needling;Taping;Functional mobility training;Patient/family education;Energy conservation;Splinting;Vasopneumatic Device    PT Next Visit Plan  reassess HEP    Consulted and Agree with Plan of Care  Patient       Patient will benefit from skilled therapeutic intervention in order to improve the following deficits and impairments:  Decreased activity tolerance, Decreased endurance, Decreased range of motion, Decreased strength, Pain, Postural dysfunction  Visit Diagnosis: Acute pain of  left shoulder  Stiffness of left shoulder, not elsewhere classified  Chronic right shoulder pain  Stiffness of right shoulder, not elsewhere classified  Muscle weakness (generalized)  Other symptoms and signs involving the musculoskeletal system     Problem List There are no active problems to display for this patient.   Janene Harvey, PT, DPT 09/23/18 4:26 PM   [facility] [address]  [place], [place], [address] Phone: [phone]   Fax:  [phone]  Name: Shannon Delacruz MRN: [id number] Date of Birth: 21-Nov-1960

## 2018-09-29 ENCOUNTER — Encounter: Payer: Self-pay | Admitting: Physical Therapy

## 2018-09-29 ENCOUNTER — Ambulatory Visit: Payer: Medicare Other | Admitting: Physical Therapy

## 2018-09-29 DIAGNOSIS — M6281 Muscle weakness (generalized): Secondary | ICD-10-CM

## 2018-09-29 DIAGNOSIS — M25612 Stiffness of left shoulder, not elsewhere classified: Secondary | ICD-10-CM

## 2018-09-29 DIAGNOSIS — M25512 Pain in left shoulder: Secondary | ICD-10-CM

## 2018-09-29 DIAGNOSIS — M25511 Pain in right shoulder: Secondary | ICD-10-CM

## 2018-09-29 DIAGNOSIS — M25611 Stiffness of right shoulder, not elsewhere classified: Secondary | ICD-10-CM

## 2018-09-29 DIAGNOSIS — R29898 Other symptoms and signs involving the musculoskeletal system: Secondary | ICD-10-CM

## 2018-09-29 DIAGNOSIS — G8929 Other chronic pain: Secondary | ICD-10-CM

## 2018-09-29 NOTE — Therapy (Signed)
[facility] [address]  [place], [place], [address] Phone: [phone]   Fax:  [phone]  Physical Therapy Treatment  Patient Details  Name: Shannon Delacruz MRN: [id number] Date of Birth: [birth year] Referring Provider (PT): Joni Fears MD   Encounter Date: 09/29/2018  PT End of Session - 09/29/18 1554    Visit Number  5    Number of Visits  16    Date for PT Re-Evaluation  11/04/18    Authorization Type  [employer] MCR     PT Start Time  1400    PT Stop Time  1450    PT Time Calculation (min)  50 min    Activity Tolerance  Patient tolerated treatment well;Patient limited by pain    Behavior During Therapy  [facility] for tasks assessed/performed       Past Medical History:  Diagnosis Date  . Cancer ([place])    UTERINE  . DDD (degenerative disc disease), lumbar   . Osteoarthritis     Past Surgical History:  Procedure Laterality Date  . ABDOMINAL HYSTERECTOMY    . APPENDECTOMY    . BACK SURGERY  2015  . HIP SURGERY      There were no vitals filed for this visit.  Subjective Assessment - 09/29/18 1351    Subjective  Reports that she came from the [facility], was doing the bike. Reports still having significant pain in L shoulder. Had an episode where it felt like "needles scratching me" and "phantom itching" in L tricep a couple days ago.     Pertinent History  OHY:[id number] Cancer,lumbar DDD,OA, back surgery 2015, hip surgery    Diagnostic tests  09/20/18 L shoulder xray: There is a little subchondral sclerosis with small subchondral cysts in the superior humeral head.  No acute changes.  No ectopic calcification.  Humeral head is centered about the glenoid.  Degenerative changes at the chromic clavicular joint.  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  Yes    Pain Score  1     Pain Location  Shoulder    Pain Orientation  Right     Pain Descriptors / Indicators  Aching;Dull    Pain Type  Chronic pain    Pain Score  6    Pain Location  Shoulder    Pain Orientation  Left    Pain Descriptors / Indicators  Dull;Sharp    Pain Type  Acute pain                       [facility] Adult PT Treatment/Exercise - 09/29/18 0001      Shoulder Exercises: Supine   External Rotation  --      Shoulder Exercises: Seated   Flexion  AAROM;Right;Left;10 reps    Flexion Limitations  10x3" orange ball rollouts to tolerance    Abduction  AAROM;Right;Left;10 reps    ABduction Limitations  10x3" orange ball rollouts to tolerance      Shoulder Exercises: Sidelying   External Rotation  AROM;Left;Right;10 reps    External Rotation Limitations  dowel under elbow  and cues for scap retraction    ABduction  AROM;Right;Left;10 reps   limited ROM on R d/t apprehension and popping   ABduction Limitations  thumb up; to tolerance      Shoulder Exercises: Standing  External Rotation  Strengthening;Right;10 reps;Theraband    Theraband Level (Shoulder External Rotation)  Level 1 (Yellow)    External Rotation Weight (lbs)  step outs with dowel   cues for scap retraction   Internal Rotation  Strengthening;Right;10 reps;Theraband    Theraband Level (Shoulder Internal Rotation)  Level 1 (Yellow)    Internal Rotation Limitations  step outs with dowel      Shoulder Exercises: Pulleys   Flexion  3 minutes    Flexion Limitations  to tolerance    Scaption  3 minutes    Scaption Limitations  to tolerance      Ultrasound   Ultrasound Location  R anterior shoulder    Ultrasound Parameters  20%, 0.5 w/cm2, 1Mhz; 8 min    Ultrasound Goals  Pain      Manual Therapy   Manual Therapy  Soft tissue mobilization;Myofascial release    Soft tissue mobilization  gentle STM to L biceps, triceps, infraspinatus- tender throughout with soft tissue restriction    Myofascial Release  TPR to L biceps and triceps               PT Short  Term Goals - 09/29/18 1556      PT SHORT TERM GOAL #1   Title  Pt will be I and compliant with initial HEP.    Time  3    Period  Weeks    Status  On-going        PT Long Term Goals - 09/29/18 1556      PT LONG TERM GOAL #1   Title  Patient to be independent with advanced HEP.    Time  6    Period  Weeks    Status  On-going      PT LONG TERM GOAL #2   Title  Pt will improve B shoulder AROM WFL and without pain limiting.     Time  6    Period  Weeks    Status  On-going      PT LONG TERM GOAL #3   Title  Pt will improve B shoulder strength to 4+/5 overall.     Time  6    Period  Weeks    Status  On-going      PT LONG TERM GOAL #4   Title  Pt will be able to reach behind her back and to lift carry groceries with less than 1-2/10 pain.     Time  6    Period  Weeks    Status  On-going            Plan - 09/29/18 1554    Clinical Impression Statement  Patient arrived to session with report of persisting L shoulder pain, with one episode of paresthesia in L triceps. Patient with good tissue response to STM to L biceps, triceps, infraspinatus. Able to tolerate gentle gravity eliminated ROM in sidelying on L UE, with slight weighted resistance on R UE. Patient with remaining slight apprehension to end ROM. Required manual and verbal cues to maintain neutral shoulder positioning and slight scapular retraction with IR/ER isometric step-outs. Patient requested [place] to R shoulder at end of session for pain relief and promotion of healing. Ended session with no complaints.     Clinical Impairments Affecting Rehab Potential  OA, lumbar DDD, uterine CA, hip surgery, back surgery    PT Treatment/Interventions  Cryotherapy;Electrical Stimulation;Iontophoresis 4mg /ml Dexamethasone;Moist Heat;Ultrasound;Therapeutic activities;Therapeutic exercise;Neuromuscular re-education;Manual techniques;Passive range of motion;Dry needling;Taping;Functional mobility training;Patient/family  education;Energy conservation;Splinting;Vasopneumatic  Device    PT Next Visit Plan  progress B shoulder strengthening    Consulted and Agree with Plan of Care  Patient       Patient will benefit from skilled therapeutic intervention in order to improve the following deficits and impairments:  Decreased activity tolerance, Decreased endurance, Decreased range of motion, Decreased strength, Pain, Postural dysfunction  Visit Diagnosis: Acute pain of left shoulder  Stiffness of left shoulder, not elsewhere classified  Chronic right shoulder pain  Stiffness of right shoulder, not elsewhere classified  Muscle weakness (generalized)  Other symptoms and signs involving the musculoskeletal system     Problem List There are no active problems to display for this patient.   Janene Harvey, PT, DPT 09/29/18 3:58 PM   [facility] [address]  [place], [place], [address] Phone: [phone]   Fax:  [phone]  Name: Jalexis Breed MRN: [id number] Date of Birth: 07-31-1961

## 2018-10-01 ENCOUNTER — Ambulatory Visit: Payer: Medicare Other | Admitting: Physical Therapy

## 2018-10-05 ENCOUNTER — Encounter: Payer: Self-pay | Admitting: Physical Therapy

## 2018-10-05 ENCOUNTER — Ambulatory Visit: Payer: Medicare Other | Admitting: Physical Therapy

## 2018-10-05 DIAGNOSIS — R29898 Other symptoms and signs involving the musculoskeletal system: Secondary | ICD-10-CM

## 2018-10-05 DIAGNOSIS — M25511 Pain in right shoulder: Secondary | ICD-10-CM | POA: Diagnosis not present

## 2018-10-05 DIAGNOSIS — M25612 Stiffness of left shoulder, not elsewhere classified: Secondary | ICD-10-CM

## 2018-10-05 DIAGNOSIS — M25512 Pain in left shoulder: Secondary | ICD-10-CM

## 2018-10-05 DIAGNOSIS — G8929 Other chronic pain: Secondary | ICD-10-CM

## 2018-10-05 DIAGNOSIS — M25611 Stiffness of right shoulder, not elsewhere classified: Secondary | ICD-10-CM

## 2018-10-05 DIAGNOSIS — M6281 Muscle weakness (generalized): Secondary | ICD-10-CM

## 2018-10-05 NOTE — Therapy (Signed)
[facility] [address]  [place], [place], [address] Phone: [phone]   Fax:  [phone]  Physical Therapy Treatment  Patient Details  Name: Shannon Delacruz MRN: [id number] Date of Birth: 1960-10-04 Referring Provider (PT): Joni Fears MD   Encounter Date: 10/05/2018  PT End of Session - 10/05/18 1152    Visit Number  6    Number of Visits  16    Date for PT Re-Evaluation  11/04/18    Authorization Type  UHC MCR     PT Start Time  1100    PT Stop Time  1146    PT Time Calculation (min)  46 min    Activity Tolerance  Patient tolerated treatment well;Patient limited by pain    Behavior During Therapy  [facility] for tasks assessed/performed       Past Medical History:  Diagnosis Date  . Cancer ([place])    UTERINE  . DDD (degenerative disc disease), lumbar   . Osteoarthritis     Past Surgical History:  Procedure Laterality Date  . ABDOMINAL HYSTERECTOMY    . APPENDECTOMY    . BACK SURGERY  2015  . HIP SURGERY      There were no vitals filed for this visit.  Subjective Assessment - 10/05/18 1101    Subjective  Reports she believes she is doing a little better, likely d/t the weather. Still having considerable pain with reaching back.     Pertinent History  GHW:[id number] Cancer,lumbar DDD,OA, back surgery 2015, hip surgery    Diagnostic tests  09/20/18 L shoulder xray: There is a little subchondral sclerosis with small subchondral cysts in the superior humeral head.  No acute changes.  No ectopic calcification.  Humeral head is centered about the glenoid.  Degenerative changes at the chromic clavicular joint.  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  Yes    Pain Score  1     Pain Location  Shoulder    Pain Orientation  Right    Pain Descriptors / Indicators  Aching;Dull    Pain Type  Chronic pain    Multiple Pain  Sites  Yes    Pain Score  3    Pain Location  Shoulder    Pain Orientation  Left    Pain Descriptors / Indicators  Dull;Sharp    Pain Type  Acute pain                       [facility] Adult PT Treatment/Exercise - 10/05/18 0001      Shoulder Exercises: Seated   External Rotation  AAROM;Right;Left;10 reps    External Rotation Limitations  with cane, to tolerance    Internal Rotation  AAROM;Right;Left;10 reps    Internal Rotation Limitations  with cane, to tolerance    Flexion  AAROM;Right;Left;10 reps    Flexion Limitations  with cane, to tolerance    Abduction  AAROM;Right;Left;10 reps   unable to tolerate on R side d/t L shoulder pain   ABduction Limitations  with cane, to tolerance      Shoulder Exercises: Prone   Extension  Strengthening;Right;Left;10 reps    Extension Weight (lbs)  1    Extension Limitations  1# on R, 0# on L; manual cues for positioning    Other Prone Exercises  R &  L prone over orange pball row x10 2# on R, 0# on L   manual cues for proper positioning     Shoulder Exercises: Standing   External Rotation  Strengthening;Right;10 reps;Theraband    Theraband Level (Shoulder External Rotation)  Level 1 (Yellow)    External Rotation Weight (lbs)  step outs with cues to maintain elbow at trunk    Internal Rotation  Strengthening;Right;10 reps;Theraband    Theraband Level (Shoulder Internal Rotation)  Level 1 (Yellow)    Internal Rotation Limitations  step outs with cues to maintain elbow at trunk    Row  Strengthening;Both;10 reps;Theraband    Theraband Level (Shoulder Row)  Level 1 (Yellow)    Row Limitations  cues to avoid shoulder hiking      Shoulder Exercises: Pulleys   Flexion  3 minutes    Flexion Limitations  to tolerance    Scaption  3 minutes    Scaption Limitations  to tolerance      Ultrasound   Ultrasound Location  L anterior shoulder    Ultrasound Parameters  20%, 0.5 w/cm2, 1 Mhz, 8 min    Ultrasound Goals  Pain      Manual  Therapy   Manual Therapy  Soft tissue mobilization;Myofascial release    Soft tissue mobilization  gentle STM to L biceps- palpable soft tissue restriction in lateral biceps    Myofascial Release  TPR to L biceps                PT Short Term Goals - 10/05/18 1155      PT SHORT TERM GOAL #1   Title  Pt will be I and compliant with initial HEP.    Time  3    Period  Weeks    Status  Achieved        PT Long Term Goals - 09/29/18 1556      PT LONG TERM GOAL #1   Title  Patient to be independent with advanced HEP.    Time  6    Period  Weeks    Status  On-going      PT LONG TERM GOAL #2   Title  Pt will improve B shoulder AROM WFL and without pain limiting.     Time  6    Period  Weeks    Status  On-going      PT LONG TERM GOAL #3   Title  Pt will improve B shoulder strength to 4+/5 overall.     Time  6    Period  Weeks    Status  On-going      PT LONG TERM GOAL #4   Title  Pt will be able to reach behind her back and to lift carry groceries with less than 1-2/10 pain.     Time  6    Period  Weeks    Status  On-going            Plan - 10/05/18 1152    Clinical Impression Statement  Patient arrived to session with report of mild improvement in B shoulder pain- attributes this from weather change and [place] last session. Requesting to use [place] again today and bringing in essential oil lotion, requesting to use this for STM. Worked on sitting B shoulder AAROM to patient's tolerance. Patient still imitated in ROM but visibly improved since last session. Introduced prone row and extension with manual assistance for proper positioning. Able to tolerate weighted resistance on R, without  weight on L. Tolerated [place] to L anterior shoulder for pain relief, as well as gentle STM to L biceps. Patient with palpable soft tissue restriction in lateral biceps and tenderness, but tolerable. Ended session with no complaints.     Clinical Impairments Affecting Rehab Potential  OA, lumbar  DDD, uterine CA, hip surgery, back surgery    PT Treatment/Interventions  Cryotherapy;Electrical Stimulation;Iontophoresis 4mg /ml Dexamethasone;Moist Heat;Ultrasound;Therapeutic activities;Therapeutic exercise;Neuromuscular re-education;Manual techniques;Passive range of motion;Dry needling;Taping;Functional mobility training;Patient/family education;Energy conservation;Splinting;Vasopneumatic Device    PT Next Visit Plan  progress B shoulder strengthening    Consulted and Agree with Plan of Care  Patient       Patient will benefit from skilled therapeutic intervention in order to improve the following deficits and impairments:  Decreased activity tolerance, Decreased endurance, Decreased range of motion, Decreased strength, Pain, Postural dysfunction  Visit Diagnosis: Acute pain of left shoulder  Stiffness of left shoulder, not elsewhere classified  Chronic right shoulder pain  Stiffness of right shoulder, not elsewhere classified  Muscle weakness (generalized)  Other symptoms and signs involving the musculoskeletal system     Problem List There are no active problems to display for this patient.   Janene Harvey, PT, DPT 10/05/18 11:56 AM   [facility] [address]  [place], [place], [address] Phone: [phone]   Fax:  [phone]  Name: Shannon Delacruz MRN: [id number] Date of Birth: 1961-02-23

## 2018-10-07 ENCOUNTER — Ambulatory Visit: Payer: Medicare Other | Admitting: Physical Therapy

## 2018-10-08 ENCOUNTER — Encounter: Payer: Self-pay | Admitting: Physical Therapy

## 2018-10-08 ENCOUNTER — Ambulatory Visit: Payer: Medicare Other | Admitting: Physical Therapy

## 2018-10-08 DIAGNOSIS — M25611 Stiffness of right shoulder, not elsewhere classified: Secondary | ICD-10-CM

## 2018-10-08 DIAGNOSIS — M25612 Stiffness of left shoulder, not elsewhere classified: Secondary | ICD-10-CM

## 2018-10-08 DIAGNOSIS — M6281 Muscle weakness (generalized): Secondary | ICD-10-CM

## 2018-10-08 DIAGNOSIS — M25511 Pain in right shoulder: Secondary | ICD-10-CM

## 2018-10-08 DIAGNOSIS — R29898 Other symptoms and signs involving the musculoskeletal system: Secondary | ICD-10-CM

## 2018-10-08 DIAGNOSIS — G8929 Other chronic pain: Secondary | ICD-10-CM

## 2018-10-08 DIAGNOSIS — M25512 Pain in left shoulder: Secondary | ICD-10-CM

## 2018-10-08 NOTE — Therapy (Signed)
[facility] [address]  [place], [place], [address] Phone: [phone]   Fax:  [phone]  Physical Therapy Treatment  Patient Details  Name: Shannon Delacruz MRN: [id number] Date of Birth: Feb 20, 1961 Referring Provider (PT): Joni Fears MD   Encounter Date: 10/08/2018  PT End of Session - 10/08/18 0921    Visit Number  7    Number of Visits  16    Date for PT Re-Evaluation  11/04/18    Authorization Type  UHC MCR     PT Start Time  0845    PT Stop Time  0939    PT Time Calculation (min)  54 min    Activity Tolerance  Patient tolerated treatment well;Patient limited by pain    Behavior During Therapy  [facility] for tasks assessed/performed       Past Medical History:  Diagnosis Date  . Cancer ([place])    UTERINE  . DDD (degenerative disc disease), lumbar   . Osteoarthritis     Past Surgical History:  Procedure Laterality Date  . ABDOMINAL HYSTERECTOMY    . APPENDECTOMY    . BACK SURGERY  2015  . HIP SURGERY      There were no vitals filed for this visit.  Subjective Assessment - 10/08/18 0846    Subjective  Patient reports that she is in more pain today- believes it is the weather. Notes that she did not have pain after last session.     Pertinent History  EYC:[id number] Cancer,lumbar DDD,OA, back surgery 2015, hip surgery    Diagnostic tests  09/20/18 L shoulder xray: There is a little subchondral sclerosis with small subchondral cysts in the superior humeral head.  No acute changes.  No ectopic calcification.  Humeral head is centered about the glenoid.  Degenerative changes at the chromic clavicular joint.  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  Yes    Pain Score  4     Pain Location  Shoulder    Pain Orientation  Right    Pain Descriptors / Indicators  Aching    Pain Type  Chronic pain    Pain Score  7    Pain Location  Shoulder    Pain Orientation  Left    Pain Descriptors / Indicators  Sharp;Dull    Pain Type  Acute pain                       [facility] Adult PT Treatment/Exercise - 10/08/18 0001      Shoulder Exercises: Seated   External Rotation  AAROM;Right;Left;10 reps    External Rotation Limitations  with cane, to tolerance    Internal Rotation  AAROM;Right;Left;10 reps    Internal Rotation Limitations  with cane, to tolerance    Flexion  AAROM;Right;Left;10 reps    Flexion Limitations  with cane, to tolerance    Abduction  AAROM;Right;Left;5 reps   limited ROM d/t pain   ABduction Limitations  with cane, to tolerance      Shoulder Exercises: Standing   External Rotation  Strengthening;Right;10 reps;Theraband    Theraband Level (Shoulder External Rotation)  Level 1 (Yellow)    External Rotation Weight (lbs)  dowel under elbow for neutral positio    Internal Rotation  Strengthening;Right;10 reps;Theraband    Theraband Level (Shoulder Internal Rotation)  Level 1 (Yellow)    Internal Rotation Limitations  dowel under elbow for neutral positio    Row  Strengthening;Both;Theraband;15 reps    Theraband Level (Shoulder Row)  Level 2 (Red);Level 1 (Yellow)    Row Limitations  2x15; cues for scap retraction   patient with mild c/o pain with red TB, better with yellow     Shoulder Exercises: Pulleys   Flexion  3 minutes    Flexion Limitations  to tolerance    Scaption  3 minutes    Scaption Limitations  to tolerance      Shoulder Exercises: Isometric Strengthening   External Rotation  5X5"    External Rotation Limitations  to tolerance    Internal Rotation  5X5"    Internal Rotation Limitations  to tolerance      Shoulder Exercises: Stretch   Other Shoulder Stretches  R & L shoulder IR/ER stretch with strap 5x3" in each direction on each UE      Ultrasound   Ultrasound Location  L anterior shoulder    Ultrasound Parameters  20%, 0.5 w/cm2, 1Mhz, 8 min     Ultrasound Goals  Pain             PT Education - 10/08/18 0921    Education Details  update to HEP; administered yellow TB    Person(s) Educated  Patient    Methods  Explanation;Demonstration;Tactile cues;Verbal cues;Handout    Comprehension  Verbalized understanding;Returned demonstration       PT Short Term Goals - 10/05/18 1155      PT SHORT TERM GOAL #1   Title  Pt will be I and compliant with initial HEP.    Time  3    Period  Weeks    Status  Achieved        PT Long Term Goals - 09/29/18 1556      PT LONG TERM GOAL #1   Title  Patient to be independent with advanced HEP.    Time  6    Period  Weeks    Status  On-going      PT LONG TERM GOAL #2   Title  Pt will improve B shoulder AROM WFL and without pain limiting.     Time  6    Period  Weeks    Status  On-going      PT LONG TERM GOAL #3   Title  Pt will improve B shoulder strength to 4+/5 overall.     Time  6    Period  Weeks    Status  On-going      PT LONG TERM GOAL #4   Title  Pt will be able to reach behind her back and to lift carry groceries with less than 1-2/10 pain.     Time  6    Period  Weeks    Status  On-going            Plan - 10/08/18 0941    Clinical Impression Statement  Patient arrived to session with report of increased stiffness and pain in B shoulders this AM d/t weather. Worked on [employer] of B shoulders with patient requiring cues to correct form and promote proper hand positioning. Progressed banded resistance exercises today to patient's tolerance. Able to perform rows with patient tolerating yellow resistance better than red d/t pain. Patient still reporting considerable pain with IR stretch on L- advised not to push into pain. Patient requested [place] at end of session as  she reports benefit from this modality. No complaints at end of session.    Clinical Impairments Affecting Rehab Potential  OA, lumbar DDD, uterine CA, hip surgery, back surgery    PT  Treatment/Interventions  Cryotherapy;Electrical Stimulation;Iontophoresis 4mg /ml Dexamethasone;Moist Heat;Ultrasound;Therapeutic activities;Therapeutic exercise;Neuromuscular re-education;Manual techniques;Passive range of motion;Dry needling;Taping;Functional mobility training;Patient/family education;Energy conservation;Splinting;Vasopneumatic Device    PT Next Visit Plan  progress B shoulder strengthening    Consulted and Agree with Plan of Care  Patient       Patient will benefit from skilled therapeutic intervention in order to improve the following deficits and impairments:  Decreased activity tolerance, Decreased endurance, Decreased range of motion, Decreased strength, Pain, Postural dysfunction  Visit Diagnosis: Acute pain of left shoulder  Stiffness of left shoulder, not elsewhere classified  Chronic right shoulder pain  Stiffness of right shoulder, not elsewhere classified  Muscle weakness (generalized)  Other symptoms and signs involving the musculoskeletal system     Problem List There are no active problems to display for this patient.   Janene Harvey, PT, DPT 10/08/18 9:43 AM   [facility] [address]  [place], [place], [address] Phone: [phone]   Fax:  [phone]  Name: Shannon Delacruz MRN: [id number] Date of Birth: September 08, 1961

## 2018-10-19 ENCOUNTER — Ambulatory Visit: Payer: Medicare Other | Attending: Orthopaedic Surgery | Admitting: Physical Therapy

## 2018-10-19 ENCOUNTER — Encounter: Payer: Self-pay | Admitting: Physical Therapy

## 2018-10-19 DIAGNOSIS — M25512 Pain in left shoulder: Secondary | ICD-10-CM

## 2018-10-19 DIAGNOSIS — M6281 Muscle weakness (generalized): Secondary | ICD-10-CM | POA: Diagnosis present

## 2018-10-19 DIAGNOSIS — M25612 Stiffness of left shoulder, not elsewhere classified: Secondary | ICD-10-CM | POA: Insufficient documentation

## 2018-10-19 DIAGNOSIS — R29898 Other symptoms and signs involving the musculoskeletal system: Secondary | ICD-10-CM | POA: Insufficient documentation

## 2018-10-19 DIAGNOSIS — M25511 Pain in right shoulder: Secondary | ICD-10-CM | POA: Insufficient documentation

## 2018-10-19 DIAGNOSIS — G8929 Other chronic pain: Secondary | ICD-10-CM | POA: Diagnosis present

## 2018-10-19 DIAGNOSIS — M25611 Stiffness of right shoulder, not elsewhere classified: Secondary | ICD-10-CM | POA: Diagnosis present

## 2018-10-19 NOTE — Therapy (Signed)
[facility] [address]  [place], [place], [address] Phone: [phone]   Fax:  [phone]  Physical Therapy Treatment  Patient Details  Name: Shannon Delacruz MRN: [id number] Date of Birth: 1961-04-07 Referring Provider (PT): Joni Fears MD   Encounter Date: 10/19/2018  PT End of Session - 10/19/18 1112    Visit Number  8    Number of Visits  16    Date for PT Re-Evaluation  11/04/18    Authorization Type  UHC MCR     PT Start Time  7672    PT Stop Time  1103    PT Time Calculation (min)  48 min    Activity Tolerance  Patient tolerated treatment well;Patient limited by pain    Behavior During Therapy  [facility] for tasks assessed/performed       Past Medical History:  Diagnosis Date  . Cancer ([place])    UTERINE  . DDD (degenerative disc disease), lumbar   . Osteoarthritis     Past Surgical History:  Procedure Laterality Date  . ABDOMINAL HYSTERECTOMY    . APPENDECTOMY    . BACK SURGERY  2015  . HIP SURGERY      There were no vitals filed for this visit.  Subjective Assessment - 10/19/18 1016    Subjective  Reports she still has trouble putting her arm behind her. Reports that she sees very little improvement in L shoulder.     Pertinent History  CNO:[id number] Cancer,lumbar DDD,OA, back surgery 2015, hip surgery    Diagnostic tests  09/20/18 L shoulder xray: There is a little subchondral sclerosis with small subchondral cysts in the superior humeral head.  No acute changes.  No ectopic calcification.  Humeral head is centered about the glenoid.  Degenerative changes at the chromic clavicular joint.  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  Yes    Pain Score  4     Pain Location  Shoulder    Pain Orientation  Right    Pain Descriptors / Indicators  Aching    Pain Type  Chronic pain    Multiple Pain Sites  Yes     Pain Score  6    Pain Location  Shoulder    Pain Orientation  Left    Pain Descriptors / Indicators  Dull;Sharp    Pain Type  Acute pain                       [facility] Adult PT Treatment/Exercise - 10/19/18 0001      Shoulder Exercises: Seated   Horizontal ABduction  Strengthening;Both;10 reps;Theraband    Theraband Level (Shoulder Horizontal ABduction)  Level 1 (Yellow)    Horizontal ABduction Limitations  2x10; cues to work within limited ROM   c/o R shoulder popping- better with scap squeeze   External Rotation  Strengthening;Both;10 reps;Theraband    Theraband Level (Shoulder External Rotation)  Level 1 (Yellow)    External Rotation Limitations  2x10; cues to maintain elbows in to side    Flexion  AAROM;Right;Left;10 reps    Flexion Limitations  with cane, to tolerance   improved ROM today   Other Seated Exercises  R & L scaption x10 each within limited range   cues to avoid shoulder hiking and straining at end range  Shoulder Exercises: Prone   Other Prone Exercises  R & L prone over orange pball row x10 2# on R, 1# on L    Other Prone Exercises  R & L prone extension over orange pball x10 2# on R, 1# on L      Shoulder Exercises: Standing   Extension  Strengthening;Both;Theraband;15 reps    Theraband Level (Shoulder Extension)  Level 2 (Red)    Extension Weight (lbs)  cues for scap retraction    Row  Strengthening;Both;Theraband;15 reps    Theraband Level (Shoulder Row)  Level 2 (Red)    Row Limitations  good tolerance      Shoulder Exercises: Pulleys   Flexion  3 minutes    Flexion Limitations  to tolerance    Scaption  3 minutes    Scaption Limitations  to tolerance      Manual Therapy   Manual Therapy  Soft tissue mobilization;Myofascial release    Soft tissue mobilization  gentle STM to L biceps and triceps- palpable soft tissue restriction throughout    Myofascial Release  TPR to L biceps- significant tender trigger points throughout              PT Education - 10/19/18 1112    Education Details  update to HEP; administered red TB    Person(s) Educated  Patient    Methods  Explanation;Demonstration;Tactile cues;Verbal cues;Handout    Comprehension  Verbalized understanding;Returned demonstration       PT Short Term Goals - 10/05/18 1155      PT SHORT TERM GOAL #1   Title  Pt will be I and compliant with initial HEP.    Time  3    Period  Weeks    Status  Achieved        PT Long Term Goals - 09/29/18 1556      PT LONG TERM GOAL #1   Title  Patient to be independent with advanced HEP.    Time  6    Period  Weeks    Status  On-going      PT LONG TERM GOAL #2   Title  Pt will improve B shoulder AROM WFL and without pain limiting.     Time  6    Period  Weeks    Status  On-going      PT LONG TERM GOAL #3   Title  Pt will improve B shoulder strength to 4+/5 overall.     Time  6    Period  Weeks    Status  On-going      PT LONG TERM GOAL #4   Title  Pt will be able to reach behind her back and to lift carry groceries with less than 1-2/10 pain.     Time  6    Period  Weeks    Status  On-going            Plan - 10/19/18 1112    Clinical Impression Statement  Patient arrived to session with report of persistent B shoulder pain, with L still worse. Worked on progressive RTC and periscapular strengthening this session on B UEs. Patient required cues to maintain elbows in at sides during B ER with light banded resistance. Progressed prone rows and extension over physioball with increased resistance on L UE with good form and tolerance. Patient with good improvement in observable B shoulder flexion with wand. Able to work on R & L shoulder scaption to tolerance and with  cues to avoid straining and shoulder hiking at end range. Increased R shoulder scaption ROM compared to L. Ended session with STM and TPR to L biceps and triceps- patient with significant tender trigger points throughout biceps today  which improved after manual therapy. Patient admits to vacuuming and mopping over the weekend that could have contributed to muscle restriction. Patient reporting mild muscle soreness at end of session. Updated HEP to reflect exercises that were well tolerated today. Patient reported understanding.     Clinical Impairments Affecting Rehab Potential  OA, lumbar DDD, uterine CA, hip surgery, back surgery    PT Treatment/Interventions  Cryotherapy;Electrical Stimulation;Iontophoresis 4mg /ml Dexamethasone;Moist Heat;Ultrasound;Therapeutic activities;Therapeutic exercise;Neuromuscular re-education;Manual techniques;Passive range of motion;Dry needling;Taping;Functional mobility training;Patient/family education;Energy conservation;Splinting;Vasopneumatic Device    PT Next Visit Plan  progress B shoulder strengthening    Consulted and Agree with Plan of Care  Patient       Patient will benefit from skilled therapeutic intervention in order to improve the following deficits and impairments:  Decreased activity tolerance, Decreased endurance, Decreased range of motion, Decreased strength, Pain, Postural dysfunction  Visit Diagnosis: Acute pain of left shoulder  Stiffness of left shoulder, not elsewhere classified  Chronic right shoulder pain  Stiffness of right shoulder, not elsewhere classified  Muscle weakness (generalized)  Other symptoms and signs involving the musculoskeletal system     Problem List There are no active problems to display for this patient.   Janene Harvey, PT, DPT 10/19/18 11:16 AM   [facility] [address]  [place], [place], [address] Phone: [phone]   Fax:  [phone]  Name: Shannon Delacruz MRN: [id number] Date of Birth: 1961/07/19

## 2018-10-21 ENCOUNTER — Ambulatory Visit: Payer: Medicare Other | Admitting: Physical Therapy

## 2018-10-21 ENCOUNTER — Encounter: Payer: Self-pay | Admitting: Physical Therapy

## 2018-10-21 VITALS — BP 130/91 | HR 71

## 2018-10-21 DIAGNOSIS — R29898 Other symptoms and signs involving the musculoskeletal system: Secondary | ICD-10-CM

## 2018-10-21 DIAGNOSIS — M25512 Pain in left shoulder: Secondary | ICD-10-CM

## 2018-10-21 DIAGNOSIS — M25611 Stiffness of right shoulder, not elsewhere classified: Secondary | ICD-10-CM

## 2018-10-21 DIAGNOSIS — M25511 Pain in right shoulder: Secondary | ICD-10-CM

## 2018-10-21 DIAGNOSIS — M6281 Muscle weakness (generalized): Secondary | ICD-10-CM

## 2018-10-21 DIAGNOSIS — G8929 Other chronic pain: Secondary | ICD-10-CM

## 2018-10-21 DIAGNOSIS — M25612 Stiffness of left shoulder, not elsewhere classified: Secondary | ICD-10-CM

## 2018-10-21 NOTE — Therapy (Signed)
[facility] [address]  [place], [place], [address] Phone: [phone]   Fax:  [phone]  Physical Therapy Treatment  Patient Details  Name: Shannon Delacruz MRN: [id number] Date of Birth: [birth year] Referring Provider (PT): Joni Fears MD   Encounter Date: 10/21/2018  PT End of Session - 10/21/18 1406    Visit Number  9    Number of Visits  16    Date for PT Re-Evaluation  11/04/18    Authorization Type  UHC MCR     PT Start Time  6440    PT Stop Time  1400    PT Time Calculation (min)  47 min    Activity Tolerance  Patient tolerated treatment well;Patient limited by pain    Behavior During Therapy  [facility] for tasks assessed/performed       Past Medical History:  Diagnosis Date  . Cancer ([place])    UTERINE  . DDD (degenerative disc disease), lumbar   . Osteoarthritis     Past Surgical History:  Procedure Laterality Date  . ABDOMINAL HYSTERECTOMY    . APPENDECTOMY    . BACK SURGERY  2015  . HIP SURGERY      Vitals:   10/21/18 1315  BP: (!) 130/91  Pulse: 71  SpO2: 97%    Subjective Assessment - 10/21/18 1314    Subjective  Reports this AM and yesterday night she felt really tired. This AM felt like she was going to pass out, so she checked her BP- 122/77 mmHg which is low for her. Woke up righ shooting pains in her L shoulder. Also has a slight HA today.     Pertinent History  [id number] Cancer,lumbar DDD,OA, back surgery 2015, hip surgery    Diagnostic tests  09/20/18 L shoulder xray: There is a little subchondral sclerosis with small subchondral cysts in the superior humeral head.  No acute changes.  No ectopic calcification.  Humeral head is centered about the glenoid.  Degenerative changes at the chromic clavicular joint.  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  Yes    Pain Score  2     Pain  Location  Shoulder    Pain Orientation  Right    Pain Descriptors / Indicators  Aching    Pain Type  Chronic pain    Pain Score  6    Pain Location  Shoulder    Pain Orientation  Left    Pain Descriptors / Indicators  Sharp;Dull    Pain Type  Acute pain                       [facility] Adult PT Treatment/Exercise - 10/21/18 0001      Shoulder Exercises: Seated   Horizontal ABduction  Strengthening;Both;10 reps;Theraband    Theraband Level (Shoulder Horizontal ABduction)  Level 2 (Red);Level 1 (Yellow)    Horizontal ABduction Limitations  1st set red, 2nd set yellow; cues for scap retraction    Flexion  AAROM;Right;Left;10 reps    Flexion Limitations  10x3" flexion rollout with orange pball    Abduction  AAROM;Right;Left;10 reps    ABduction Limitations  10x3" flexion rollout with orange pball      Shoulder Exercises: Standing   External Rotation  Strengthening;Both;10 reps;Theraband    Theraband Level (Shoulder External Rotation)  Level  1 (Yellow)    External Rotation Weight (lbs)  2x10; cues to maintain elbows in    Extension  Strengthening;Both;Theraband;15 reps    Theraband Level (Shoulder Extension)  Level 2 (Red)    Extension Weight (lbs)  cues for scap retraction    Row  Strengthening;Both;Theraband;15 reps    Theraband Level (Shoulder Row)  Level 2 (Red)    Row Limitations  cues for scap retraction      Shoulder Exercises: Pulleys   Flexion  3 minutes    Flexion Limitations  to tolerance    Scaption  3 minutes    Scaption Limitations  to tolerance      Shoulder Exercises: Stretch   Corner Stretch  2 reps;20 seconds    Corner Stretch Limitations  mid pec stretch in doorway to tolerance      Manual Therapy   Manual Therapy  Soft tissue mobilization;Myofascial release    Soft tissue mobilization  gentle STM to L UT, L biceps and triceps- palpable soft tissue restriction throughout    Myofascial Release  TPR to L biceps and UT- significant tender trigger  points throughout               PT Short Term Goals - 10/05/18 1155      PT SHORT TERM GOAL #1   Title  Pt will be I and compliant with initial HEP.    Time  3    Period  Weeks    Status  Achieved        PT Long Term Goals - 09/29/18 1556      PT LONG TERM GOAL #1   Title  Patient to be independent with advanced HEP.    Time  6    Period  Weeks    Status  On-going      PT LONG TERM GOAL #2   Title  Pt will improve B shoulder AROM WFL and without pain limiting.     Time  6    Period  Weeks    Status  On-going      PT LONG TERM GOAL #3   Title  Pt will improve B shoulder strength to 4+/5 overall.     Time  6    Period  Weeks    Status  On-going      PT LONG TERM GOAL #4   Title  Pt will be able to reach behind her back and to lift carry groceries with less than 1-2/10 pain.     Time  6    Period  Weeks    Status  On-going            Plan - 10/21/18 1406    Clinical Impression Statement  Patient arrived to session with report of feeling tired and faint yesterday and this AM. Notes that her BP was low for her this afternoon. Vitals today WFL. Worked on progressive periscapular strengthening this session with cues required to emphasize scapular retraction. Introduced mid Geologist, engineering in doorway to patient's tolerance. Patient reported that she felt really tired- opted for sitting stretching to patient's tolerance. Patient tolerated manual therapy to L UT, biceps muscle belly, and triceps. Still with persisting trigger points and pain in this area, but improved after STM. Patient reporting improvement in pain levels at end of session.     Clinical Impairments Affecting Rehab Potential  OA, lumbar DDD, uterine CA, hip surgery, back surgery    PT Treatment/Interventions  Cryotherapy;Electrical Stimulation;Iontophoresis 4mg /ml Dexamethasone;Moist  Heat;Ultrasound;Therapeutic activities;Therapeutic exercise;Neuromuscular re-education;Manual techniques;Passive range of  motion;Dry needling;Taping;Functional mobility training;Patient/family education;Energy conservation;Splinting;Vasopneumatic Device    PT Next Visit Plan  progress B shoulder strengthening    Consulted and Agree with Plan of Care  Patient       Patient will benefit from skilled therapeutic intervention in order to improve the following deficits and impairments:  Decreased activity tolerance, Decreased endurance, Decreased range of motion, Decreased strength, Pain, Postural dysfunction  Visit Diagnosis: Acute pain of left shoulder  Stiffness of left shoulder, not elsewhere classified  Chronic right shoulder pain  Stiffness of right shoulder, not elsewhere classified  Muscle weakness (generalized)  Other symptoms and signs involving the musculoskeletal system     Problem List There are no active problems to display for this patient.    Janene Harvey, PT, DPT 10/21/18 2:08 PM   [facility] [address]  [place], [place], [address] Phone: [phone]   Fax:  [phone]  Name: Shannon Delacruz MRN: [id number] Date of Birth: [birth year]

## 2018-10-26 ENCOUNTER — Encounter: Payer: Self-pay | Admitting: Physical Therapy

## 2018-10-26 ENCOUNTER — Ambulatory Visit: Payer: Medicare Other | Admitting: Physical Therapy

## 2018-10-26 DIAGNOSIS — M25611 Stiffness of right shoulder, not elsewhere classified: Secondary | ICD-10-CM

## 2018-10-26 DIAGNOSIS — M25511 Pain in right shoulder: Secondary | ICD-10-CM

## 2018-10-26 DIAGNOSIS — M25512 Pain in left shoulder: Secondary | ICD-10-CM | POA: Diagnosis not present

## 2018-10-26 DIAGNOSIS — M25612 Stiffness of left shoulder, not elsewhere classified: Secondary | ICD-10-CM

## 2018-10-26 DIAGNOSIS — M6281 Muscle weakness (generalized): Secondary | ICD-10-CM

## 2018-10-26 DIAGNOSIS — G8929 Other chronic pain: Secondary | ICD-10-CM

## 2018-10-26 DIAGNOSIS — R29898 Other symptoms and signs involving the musculoskeletal system: Secondary | ICD-10-CM

## 2018-10-26 NOTE — Therapy (Signed)
[facility] [address]  [place], [place], [address] Phone: [phone]   Fax:  [phone]  Physical Therapy Progress Note  Patient Details  Name: Shannon Delacruz MRN: [id number] Date of Birth: Nov 28, 1960 Referring Provider (PT): Joni Fears MD  Progress Note Reporting Period 08/17/18 to 10/26/18  See note below for Objective Data and Assessment of Progress/Goals.    Encounter Date: 10/26/2018  PT End of Session - 10/26/18 1450    Visit Number  10    Number of Visits  16    Date for PT Re-Evaluation  11/04/18    Authorization Type  UHC MCR     PT Start Time  6546    PT Stop Time  1443    PT Time Calculation (min)  49 min    Activity Tolerance  Patient tolerated treatment well;Patient limited by pain    Behavior During Therapy  [facility] for tasks assessed/performed       Past Medical History:  Diagnosis Date  . Cancer ([place])    UTERINE  . DDD (degenerative disc disease), lumbar   . Osteoarthritis     Past Surgical History:  Procedure Laterality Date  . ABDOMINAL HYSTERECTOMY    . APPENDECTOMY    . BACK SURGERY  2015  . HIP SURGERY      There were no vitals filed for this visit.  Subjective Assessment - 10/26/18 1351    Subjective  Reports that she is feeling a little better. Has been doing a bunch of stuff. R arm hurts more in the AM, possibly because of how she sleeps. Reports 20% improvement in L shoulder, 30% improvement in R shoulder. Reports that she feels more stiff today.    Pertinent History  [id number] Cancer,lumbar DDD,OA, back surgery 2015, hip surgery    Diagnostic tests  09/20/18 L shoulder xray: There is a little subchondral sclerosis with small subchondral cysts in the superior humeral head.  No acute changes.  No ectopic calcification.  Humeral head is centered about the glenoid.  Degenerative changes at the chromic clavicular joint.  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  Yes    Pain Score  2     Pain Location  Shoulder    Pain Orientation  Right    Pain Descriptors / Indicators  Aching    Pain Type  Chronic pain    Pain Score  5    Pain Location  Shoulder    Pain Orientation  Left    Pain Descriptors / Indicators  Dull;Sharp         [facility] PT Assessment - 10/26/18 0001      AROM   Right/Left Shoulder  Right;Left    Right Shoulder Flexion  140 Degrees   painful popping when lowering UE   Right Shoulder ABduction  95 Degrees    Right Shoulder Internal Rotation  --   FIR T7   Right Shoulder External Rotation  --   FER T2   Left Shoulder Flexion  114 Degrees    Left Shoulder ABduction  92 Degrees    Left Shoulder Internal Rotation  --   FIR T10   Left Shoulder External Rotation  --   FER T2     Strength   Right/Left Shoulder  Right;Left    Right Shoulder Flexion  4+/5  Right Shoulder ABduction  4+/5    Right Shoulder Internal Rotation  4/5    Right Shoulder External Rotation  4/5    Left Shoulder Flexion  4/5   mild pain   Left Shoulder ABduction  4/5   mild pain   Left Shoulder Internal Rotation  4-/5    Left Shoulder External Rotation  4-/5   moderate pain                  [facility] Adult PT Treatment/Exercise - 10/26/18 0001      Shoulder Exercises: Seated   Flexion  AAROM;Right;Left;10 reps    Flexion Limitations  10x3" flexion rollout with orange pball   sharp shooting pain in L superior shoulder on 1st rep   Abduction  AAROM;Right;Left;10 reps    ABduction Limitations  10x3" flexion rollout with orange pball      Shoulder Exercises: Sidelying   External Rotation  Strengthening;Right;Left;10 reps;Weights    External Rotation Weight (lbs)  1,2    External Rotation Limitations  2x10 each UE; dowel under elbow; 1# on L, 2# on R    ABduction  AROM;Right;Left;10 reps    ABduction Limitations  thumb up; to tolerance      Shoulder  Exercises: Pulleys   Flexion  3 minutes    Flexion Limitations  to tolerance    Scaption  3 minutes    Scaption Limitations  to tolerance      Manual Therapy   Manual Therapy  Soft tissue mobilization;Myofascial release    Soft tissue mobilization  gentle STM to L proximal biceps tendon, L biceps and triceps- palpable soft tissue restriction throughout    Myofascial Release  TPR to L biceps and triceps - significant tender trigger points throughout               PT Short Term Goals - 10/26/18 1452      PT SHORT TERM GOAL #1   Title  Pt will be I and compliant with initial HEP.    Time  3    Period  Weeks    Status  Achieved        PT Long Term Goals - 10/26/18 1413      PT LONG TERM GOAL #1   Title  Patient to be independent with advanced HEP.    Time  6    Period  Weeks    Status  Partially Met   met for current     PT LONG TERM GOAL #2   Title  Pt will improve B shoulder AROM WFL and without pain limiting.     Time  6    Period  Weeks    Status  Partially Met   Patient has shown improvement in R shoulder flexion, IR, ER AROM and L shoulder abduction and ER.      PT LONG TERM GOAL #3   Title  Pt will improve B shoulder strength to 4+/5 overall.     Time  6    Period  Weeks    Status  Partially Met   Strength testing revealed improvements in R shoulder flexion and abduction strength, and L shoulder flexion, abduction, and IR strength.     PT LONG TERM GOAL #4   Title  Pt will be able to reach behind her back and to lift carry groceries with less than 1-2/10 pain.     Time  6    Period  Weeks    Status  Partially Met   R UE: 3/10 pain with reaching back, L UE: 6/10 pain with reaching back           Plan - 10/26/18 1451    Clinical Impression Statement  Patient arrived to session with report of 20% improvement in L shoulder, 30% improvement in R shoulder since starting PT. L medial proximal arm still with area of increased swelling and pain at  beginning of session, patient reporting "it has bubbled up again." Patient has shown improvement in R shoulder flexion, IR, ER AROM and L shoulder abduction and ER. Strength testing revealed improvements in R shoulder flexion and abduction strength, and L shoulder flexion, abduction, and IR strength. Patient still reports significant pain with reaching behind her and reports that she avoids lifting over 5 lbs. Worked on progressive RTC strengthening this session with increased weight today. Patient reported sharp shooting pain in L superior shoulder with shoulder flexion rollouts, which resolved after 1st rep and cues to avoid pushing into pain. Ended session with STM to L proximal biceps tendon, biceps and triceps muscle bellies, with slightly decreased sensitivity compared to last session. Patient showing slow but steady improvements towards goals.      Clinical Impairments Affecting Rehab Potential  OA, lumbar DDD, uterine CA, hip surgery, back surgery    PT Treatment/Interventions  Cryotherapy;Electrical Stimulation;Iontophoresis 20m/ml Dexamethasone;Moist Heat;Ultrasound;Therapeutic activities;Therapeutic exercise;Neuromuscular re-education;Manual techniques;Passive range of motion;Dry needling;Taping;Functional mobility training;Patient/family education;Energy conservation;Splinting;Vasopneumatic Device    PT Next Visit Plan  progress B shoulder strengthening    Consulted and Agree with Plan of Care  Patient       Patient will benefit from skilled therapeutic intervention in order to improve the following deficits and impairments:  Decreased activity tolerance, Decreased endurance, Decreased range of motion, Decreased strength, Pain, Postural dysfunction  Visit Diagnosis: Acute pain of left shoulder  Stiffness of left shoulder, not elsewhere classified  Chronic right shoulder pain  Stiffness of right shoulder, not elsewhere classified  Muscle weakness (generalized)  Other symptoms and  signs involving the musculoskeletal system     Problem List There are no active problems to display for this patient.    YJanene Harvey PT, DPT 10/26/18 3:41 PM   [facility] [address] [place] [address]Phone: [phone]  Fax:  [phone] Name: LIzza BickleMRN: [id number] of Birth: [birth year]

## 2018-10-28 ENCOUNTER — Encounter: Payer: Self-pay | Admitting: Physical Therapy

## 2018-10-28 ENCOUNTER — Ambulatory Visit: Payer: Medicare Other | Admitting: Physical Therapy

## 2018-10-28 DIAGNOSIS — M25512 Pain in left shoulder: Secondary | ICD-10-CM | POA: Diagnosis not present

## 2018-10-28 DIAGNOSIS — R29898 Other symptoms and signs involving the musculoskeletal system: Secondary | ICD-10-CM

## 2018-10-28 DIAGNOSIS — M25612 Stiffness of left shoulder, not elsewhere classified: Secondary | ICD-10-CM

## 2018-10-28 DIAGNOSIS — M25611 Stiffness of right shoulder, not elsewhere classified: Secondary | ICD-10-CM

## 2018-10-28 DIAGNOSIS — M6281 Muscle weakness (generalized): Secondary | ICD-10-CM

## 2018-10-28 DIAGNOSIS — G8929 Other chronic pain: Secondary | ICD-10-CM

## 2018-10-28 DIAGNOSIS — M25511 Pain in right shoulder: Secondary | ICD-10-CM

## 2018-10-28 NOTE — Therapy (Signed)
[facility] [address]  [place], [place], [address] Phone: [phone]   Fax:  [phone]  Physical Therapy Treatment  Patient Details  Name: Shannon Delacruz MRN: [id number] Date of Birth: [birth year] Referring Provider (PT): Joni Fears MD   Encounter Date: 10/28/2018  PT End of Session - 10/28/18 1111    Visit Number  11    Number of Visits  16    Date for PT Re-Evaluation  11/04/18    Authorization Type  [employer] MCR     PT Start Time  1027    PT Stop Time  1108    PT Time Calculation (min)  41 min    Activity Tolerance  Patient tolerated treatment well;Patient limited by pain    Behavior During Therapy  [facility] for tasks assessed/performed       Past Medical History:  Diagnosis Date  . Cancer ([place])    UTERINE  . DDD (degenerative disc disease), lumbar   . Osteoarthritis     Past Surgical History:  Procedure Laterality Date  . ABDOMINAL HYSTERECTOMY    . APPENDECTOMY    . BACK SURGERY  2015  . HIP SURGERY      There were no vitals filed for this visit.  Subjective Assessment - 10/28/18 1028    Subjective  Reports that R shoulder feels fine, L shoulder does not. Thinks that the cold weather is bothering it. Notes that she has a hx of arthritis in her neck and this is also flared up today.     Pertinent History  ONG:EXBMWUX Cancer,lumbar DDD,OA, back surgery 2015, hip surgery    Diagnostic tests  09/20/18 L shoulder xray: There is a little subchondral sclerosis with small subchondral cysts in the superior humeral head.  No acute changes.  No ectopic calcification.  Humeral head is centered about the glenoid.  Degenerative changes at the chromic clavicular joint.  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  Yes    Pain Score  2     Pain Location  Shoulder    Pain Orientation  Right    Pain Descriptors / Indicators   Aching    Pain Type  Chronic pain    Pain Score  7    Pain Location  Shoulder    Pain Orientation  Left    Pain Descriptors / Indicators  Sharp;Dull    Pain Type  Acute pain                       [facility] Adult PT Treatment/Exercise - 10/28/18 0001      Exercises   Exercises  Neck      Shoulder Exercises: Seated   Flexion  AAROM;10 reps;Right;Left    Flexion Limitations  with cane; to tolerance    Other Seated Exercises  B scapular retraction 10x3"      Shoulder Exercises: Pulleys   Flexion  3 minutes    Flexion Limitations  to tolerance    Scaption  3 minutes    Scaption Limitations  to tolerance      Shoulder Exercises: Stretch   Other Shoulder Stretches  R & L shoulder IR/ER stretch with strap 5x5" in each direction on each UE      Manual Therapy   Manual Therapy  Soft tissue mobilization;Myofascial release;Passive ROM  Manual therapy comments  supine    Soft tissue mobilization  gentle STM to L proximal biceps tendon, L biceps and triceps, UT, LS- palpable soft tissue restriction throughout    Myofascial Release  TPR to L biceps and triceps, UT, LS - significant tender trigger points throughout biceps    Passive ROM  L shoulder PROM in all planes to tolerance with gentle distraction for comfort- most limited in ABD and ER      Neck Exercises: Stretches   Upper Trapezius Stretch  Right;Left;1 rep;30 seconds    Upper Trapezius Stretch Limitations  to tolerance    Levator Stretch  Right;Left;1 rep;30 seconds    Levator Stretch Limitations  to tolerance             PT Education - 10/28/18 1110    Education Details  update to HEP    Person(s) Educated  Patient    Methods  Explanation;Demonstration;Tactile cues;Verbal cues;Handout    Comprehension  Verbalized understanding;Returned demonstration       PT Short Term Goals - 10/26/18 1452      PT SHORT TERM GOAL #1   Title  Pt will be I and compliant with initial HEP.    Time  3    Period   Weeks    Status  Achieved        PT Long Term Goals - 10/26/18 1413      PT LONG TERM GOAL #1   Title  Patient to be independent with advanced HEP.    Time  6    Period  Weeks    Status  Partially Met   met for current     PT LONG TERM GOAL #2   Title  Pt will improve B shoulder AROM WFL and without pain limiting.     Time  6    Period  Weeks    Status  Partially Met   Patient has shown improvement in R shoulder flexion, IR, ER AROM and L shoulder abduction and ER.      PT LONG TERM GOAL #3   Title  Pt will improve B shoulder strength to 4+/5 overall.     Time  6    Period  Weeks    Status  Partially Met   Strength testing revealed improvements in R shoulder flexion and abduction strength, and L shoulder flexion, abduction, and IR strength.     PT LONG TERM GOAL #4   Title  Pt will be able to reach behind her back and to lift carry groceries with less than 1-2/10 pain.     Time  6    Period  Weeks    Status  Partially Met   R UE: 3/10 pain with reaching back, L UE: 6/10 pain with reaching back           Plan - 10/28/18 1111    Clinical Impression Statement  Patient arrived to session with report of continued L shoulder pain, still believes it is d/t the weather.  Also noting flare up of neck pain today. Worked on L shoulder PROM in all planes with gentle distraction to improve tolerance. Patient most limited in abduction and ER. Tolerated STM to L biceps, triceps, UT, and LS which were all tender and with trigger points throughout. Patient continues to report benefit from [facility]. Continued to work on [employer] and stretching of B shoulders at end of session to patient's tolerance. Patient reported no increase in pain at end of session.  Updated HEP with UT and LS stretch as these were well-tolerated today. Patient reported understanding.     Clinical Impairments Affecting Rehab Potential  OA, lumbar DDD, uterine CA, hip surgery, back surgery    PT Treatment/Interventions   Cryotherapy;Electrical Stimulation;Iontophoresis 56m/ml Dexamethasone;Moist Heat;Ultrasound;Therapeutic activities;Therapeutic exercise;Neuromuscular re-education;Manual techniques;Passive range of motion;Dry needling;Taping;Functional mobility training;Patient/family education;Energy conservation;Splinting;Vasopneumatic Device    PT Next Visit Plan  progress B shoulder strengthening & ROM    Consulted and Agree with Plan of Care  Patient       Patient will benefit from skilled therapeutic intervention in order to improve the following deficits and impairments:  Decreased activity tolerance, Decreased endurance, Decreased range of motion, Decreased strength, Pain, Postural dysfunction  Visit Diagnosis: Acute pain of left shoulder  Stiffness of left shoulder, not elsewhere classified  Chronic right shoulder pain  Stiffness of right shoulder, not elsewhere classified  Muscle weakness (generalized)  Other symptoms and signs involving the musculoskeletal system     Problem List There are no active problems to display for this patient.    YJanene Harvey PT, DPT 10/28/18 11:15 AM   [facility] [place] [address]Phone: [phone]  Fax:  [phone] Name: Shannon VannatterMRN: [id number] of Birth: 130-Sep-1962

## 2018-11-02 ENCOUNTER — Ambulatory Visit: Payer: Medicare Other | Admitting: Physical Therapy

## 2018-11-02 ENCOUNTER — Encounter: Payer: Self-pay | Admitting: Physical Therapy

## 2018-11-02 DIAGNOSIS — M6281 Muscle weakness (generalized): Secondary | ICD-10-CM

## 2018-11-02 DIAGNOSIS — M25611 Stiffness of right shoulder, not elsewhere classified: Secondary | ICD-10-CM

## 2018-11-02 DIAGNOSIS — M25512 Pain in left shoulder: Secondary | ICD-10-CM

## 2018-11-02 DIAGNOSIS — M25612 Stiffness of left shoulder, not elsewhere classified: Secondary | ICD-10-CM

## 2018-11-02 DIAGNOSIS — R29898 Other symptoms and signs involving the musculoskeletal system: Secondary | ICD-10-CM

## 2018-11-02 DIAGNOSIS — G8929 Other chronic pain: Secondary | ICD-10-CM

## 2018-11-02 DIAGNOSIS — M25511 Pain in right shoulder: Secondary | ICD-10-CM

## 2018-11-02 NOTE — Therapy (Addendum)
[facility] [address]  [place], [place], [address] Phone: [phone]   Fax:  [phone]  Physical Therapy Treatment  Patient Details  Name: Shannon Delacruz MRN: [id number] Date of Birth: 03/19/1961 Referring Provider (PT): Joni Fears MD   Progress Note Reporting Period 10/28/18 to 11/02/18  See note below for Objective Data and Assessment of Progress/Goals.    Encounter Date: 11/02/2018  PT End of Session - 11/02/18 1145    Visit Number  12    Number of Visits  16    Date for PT Re-Evaluation  11/04/18    Authorization Type  [employer] MCR     PT Start Time  1102    PT Stop Time  1143    PT Time Calculation (min)  41 min    Activity Tolerance  Patient tolerated treatment well;Patient limited by pain    Behavior During Therapy  WFL for tasks assessed/performed       Past Medical History:  Diagnosis Date  . Cancer ([place])    UTERINE  . DDD (degenerative disc disease), lumbar   . Osteoarthritis     Past Surgical History:  Procedure Laterality Date  . ABDOMINAL HYSTERECTOMY    . APPENDECTOMY    . BACK SURGERY  2015  . HIP SURGERY      There were no vitals filed for this visit.  Subjective Assessment - 11/02/18 1103    Subjective  Reports that 2 days ago she had a very painful pop in her L shoulder, happened again yesterday when she braced her LOB in the shower. Has been having burning in her shoulder and significant difficulty with lifting since this popping occured.     Pertinent History  VFI:EPPIRJJ Cancer,lumbar DDD,OA, back surgery 2015, hip surgery    Diagnostic tests  09/20/18 L shoulder xray: There is a little subchondral sclerosis with small subchondral cysts in the superior humeral head.  No acute changes.  No ectopic calcification.  Humeral head is centered about the glenoid.  Degenerative changes at the chromic clavicular joint.  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  Yes    Pain Score  2     Pain Location  Shoulder    Pain Orientation  Right    Pain Descriptors / Indicators  Aching    Pain Type  Chronic pain    Pain Score  5    Pain Location  Shoulder    Pain Orientation  Left    Pain Descriptors / Indicators  --   "heat"   Pain Type  Acute pain                       [facility] Adult PT Treatment/Exercise - 11/02/18 0001      Shoulder Exercises: Supine   Protraction  Strengthening;Both;10 reps    Protraction Limitations  2x10 with cane    Flexion  AAROM;10 reps;Left    Flexion Limitations  with cane to tolerance    ABduction  AAROM;Left;10 reps    ABduction Limitations  L shoulder supported on 2 pillows; with cane to tolerance   manual cues for proper form     Shoulder Exercises: Sidelying   External Rotation  Strengthening;Right;Left;10 reps;Weights    External Rotation Weight (lbs)  0,1    External Rotation Limitations  2x10 each UE; dowel under elbow; 0# on L, 1# on R   cues for scap squeeze     Shoulder Exercises: Standing   External Rotation  Both;10 reps;Theraband;Right;Strengthening;Left    Theraband Level (Shoulder External Rotation)  Level 1 (Yellow)    External Rotation Weight (lbs)  10x each; step outs on L, concentric contraction on R    Internal Rotation  Strengthening;Right;10 reps;Theraband;Left    Theraband Level (Shoulder Internal Rotation)  Level 1 (Yellow)    Internal Rotation Limitations  10x each; step outs on L, concentric contraction on R    Extension  Strengthening;Both;Theraband;15 reps    Theraband Level (Shoulder Extension)  Level 2 (Red)    Extension Weight (lbs)  cues for scap retraction    Row  Strengthening;Both;Theraband;15 reps    Theraband Level (Shoulder Row)  Level 2 (Red)    Row Limitations  good form      Shoulder Exercises: Pulleys   Flexion  3 minutes    Flexion Limitations  to tolerance    Scaption  3  minutes    Scaption Limitations  to tolerance      Manual Therapy   Manual Therapy  Soft tissue mobilization;Myofascial release    Manual therapy comments  sitting    Soft tissue mobilization  gentle STM to L proximal biceps tendon, L biceps and triceps- increased soft tissue restriction and tenderness in proximal biceps    Myofascial Release  TPR to L biceps and triceps- considerable trigger points throughout biceps               PT Short Term Goals - 10/26/18 1452      PT SHORT TERM GOAL #1   Title  Pt will be I and compliant with initial HEP.    Time  3    Period  Weeks    Status  Achieved        PT Long Term Goals - 10/26/18 1413      PT LONG TERM GOAL #1   Title  Patient to be independent with advanced HEP.    Time  6    Period  Weeks    Status  Partially Met   met for current     PT LONG TERM GOAL #2   Title  Pt will improve B shoulder AROM WFL and without pain limiting.     Time  6    Period  Weeks    Status  Partially Met   Patient has shown improvement in R shoulder flexion, IR, ER AROM and L shoulder abduction and ER.      PT LONG TERM GOAL #3   Title  Pt will improve B shoulder strength to 4+/5 overall.     Time  6    Period  Weeks    Status  Partially Met   Strength testing revealed improvements in R shoulder flexion and abduction strength, and L shoulder flexion, abduction, and IR strength.     PT LONG TERM GOAL #4   Title  Pt will be able to reach behind her back and to lift carry groceries with less than 1-2/10 pain.     Time  6    Period  Weeks    Status  Partially Met   R UE: 3/10 pain with reaching back, L UE: 6/10 pain with reaching back           Plan - 11/02/18 1145    Clinical Impression Statement  Patient arrived to  session with report of L shoulder popping 2 days ago, then again 1 day ago that caused excruciating pain. Unsure of specific mechanism that caused the pop. Notes that ROM and lifting ability has decreased since  these popping episodes. Reports that she feels that the L shoulder is "going to go out" and pop when reaching past ~120 degrees of flexion. Tolerated L shoulder flexion and abduction AAROM within limited ROM and with mild discomfort. Able to perform sidelying ER with no resistance on L, light resistance on R, as patient also reporting some soreness with this activity on R. Required heavy correction of form with resisted IR and ER. Ended session with STM and TPR to L biceps and triceps with increased trigger points throughout proximal biceps today and increased tenderness. Ended session with no further complaints at end of session. Spoke with patient about placing her on 30 day hold next session until F/U with MD for further instruction. Patient agreeable to 30 day hold next session.     Clinical Impairments Affecting Rehab Potential  OA, lumbar DDD, uterine CA, hip surgery, back surgery    PT Treatment/Interventions  Cryotherapy;Electrical Stimulation;Iontophoresis 83m/ml Dexamethasone;Moist Heat;Ultrasound;Therapeutic activities;Therapeutic exercise;Neuromuscular re-education;Manual techniques;Passive range of motion;Dry needling;Taping;Functional mobility training;Patient/family education;Energy conservation;Splinting;Vasopneumatic Device    PT Next Visit Plan  30 day hold next session    Consulted and Agree with Plan of Care  Patient       Patient will benefit from skilled therapeutic intervention in order to improve the following deficits and impairments:  Decreased activity tolerance, Decreased endurance, Decreased range of motion, Decreased strength, Pain, Postural dysfunction  Visit Diagnosis: Acute pain of left shoulder  Stiffness of left shoulder, not elsewhere classified  Chronic right shoulder pain  Stiffness of right shoulder, not elsewhere classified  Muscle weakness (generalized)  Other symptoms and signs involving the musculoskeletal system     Problem List There are no  active problems to display for this patient.    YJanene Harvey PT, DPT 11/02/18 11:51 AM   [facility] [place] [id number]: [phone]  Fax:  [phone] Name: LLaurynn MccorveyMRN: [id number] of Birth: [birth year] PHYSICAL THERAPY DISCHARGE SUMMARY  Visits from Start of Care: 12  Current functional level related to goals / functional outcomes: Unable to assess; patient returned to MD for F/U d/t concerns about her shoulder   Remaining deficits: Unable to assess   Education / Equipment: HEP  Plan: Patient agrees to discharge.  Patient goals were not met. Patient is being discharged due to lack of progress.  ?????     YJanene Harvey PT, DPT  12/03/18 12:15 PM

## 2018-11-04 ENCOUNTER — Ambulatory Visit: Payer: Medicare Other | Admitting: Physical Therapy

## 2018-11-08 ENCOUNTER — Other Ambulatory Visit (INDEPENDENT_AMBULATORY_CARE_PROVIDER_SITE_OTHER): Payer: Self-pay | Admitting: Orthopaedic Surgery

## 2018-11-08 ENCOUNTER — Ambulatory Visit (INDEPENDENT_AMBULATORY_CARE_PROVIDER_SITE_OTHER): Payer: Medicare Other | Admitting: Orthopaedic Surgery

## 2018-11-08 ENCOUNTER — Encounter (INDEPENDENT_AMBULATORY_CARE_PROVIDER_SITE_OTHER): Payer: Self-pay | Admitting: Orthopaedic Surgery

## 2018-11-08 VITALS — BP 148/95 | HR 82 | Ht 62.0 in | Wt 240.0 lb

## 2018-11-08 DIAGNOSIS — G8929 Other chronic pain: Secondary | ICD-10-CM | POA: Diagnosis not present

## 2018-11-08 DIAGNOSIS — M25512 Pain in left shoulder: Secondary | ICD-10-CM

## 2018-11-08 MED ORDER — DIAZEPAM 5 MG PO TABS: ORAL_TABLET | ORAL | 0 refills | Status: DC

## 2018-11-08 NOTE — Addendum Note (Signed)
Addended by: Lendon Collar on: 11/08/2018 12:04 PM   Modules accepted: Orders

## 2018-11-08 NOTE — Progress Notes (Signed)
Office Visit Note   Patient: Shannon Delacruz           Date of Birth: 20-Feb-1961           MRN: [id number] Visit Date: 11/08/2018              Requested by: Neale Burly, [place], [place] [address] PCP: Neale Burly, MD   Assessment & Plan: Visit Diagnoses:  1. 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  Has been through a course of physical therapy and taking NSAIDs.  Will obtain MRI scan and have her return shortly thereafter. right shoulder doing well  Follow-Up Instructions: No follow-ups on file.   Orders:  No orders of the defined types were placed in this encounter.  No orders of the defined types were placed in this encounter.     Procedures: No procedures performed   Clinical Data: No additional findings.   Subjective: Chief Complaint  Patient presents with  . Left Shoulder - Pain  Patient presents today for a seven week follow up on her left shoulder adhesive capsulitis. She has been using voltaren gel, but that does not help. She has finished therapy, but noticed no improvement. She has pain that radiates throughout her left upper arm. She has been using essential oils at home.  Doing well in regards to her right shoulder without any feeling of instability or significant pain.  Still having difficulty raising left arm overhead difficulty sleeping on that side.  HPI  Review of Systems   Objective: Vital Signs: BP (!) 148/95   Pulse 82   Ht 5\' 2"  (1.575 m)   Wt 240 lb (108.9 kg)   BMI 43.90 kg/m   Physical Exam Constitutional:      Appearance: She is well-developed.  Eyes:     Pupils: Pupils are equal, round, and reactive to light.  Pulmonary:     Effort: Pulmonary effort is normal.  Skin:    General: Skin is warm and dry.  Neurological:     Mental Status: She is alert and oriented to person, place, and time.  Psychiatric:        Behavior:  Behavior normal.     Ortho Exam awake alert and oriented x3.  Comfortable sitting.  Still has some lack of external rotation and full overhead motion of left shoulder.  She lacked about 30 degrees to full flexion.  Has positive impingement.  No significant tenderness over the acromioclavicular joint.  Biceps appears to be intact good grip and good release.  No obvious weakness  Specialty Comments:  No specialty comments available.  Imaging: No results found.   PMFS History: There are no active problems to display for this patient.  Past Medical History:  Diagnosis Date  . Cancer ([place])    UTERINE  . DDD (degenerative disc disease), lumbar   . Osteoarthritis     History reviewed. No pertinent family history.  Past Surgical History:  Procedure Laterality Date  . ABDOMINAL HYSTERECTOMY    . APPENDECTOMY    . BACK SURGERY  2015  . HIP SURGERY     Social History   Occupational History  . Not on file  Tobacco Use  . Smoking status: Former Smoker    Packs/day: 1.00    Years: 10.00    Pack years: 10.00    Types: Cigarettes    Last attempt to quit: 08/03/1993  Years since quitting: 25.2  . Smokeless tobacco: Never Used  Substance and Sexual Activity  . Alcohol use: Yes    Comment: OCC  . Drug use: Not Currently  . Sexual activity: Not on file

## 2018-11-09 ENCOUNTER — Ambulatory Visit: Payer: Medicare Other | Admitting: Physical Therapy

## 2018-11-11 ENCOUNTER — Telehealth: Payer: Self-pay | Admitting: Rheumatology

## 2018-11-11 NOTE — Telephone Encounter (Signed)
Valium 5mg -think this was sent. Please call pt and let her know it was sent to the pharmacy

## 2018-11-11 NOTE — Telephone Encounter (Addendum)
Patient left a message requesting rx for 1 Valium be sent in for her. She is scheduled for her MRI on March 15 at 10am. Please send to [facility] on [address] in [place].

## 2018-11-15 NOTE — Telephone Encounter (Signed)
Called and spoke with patient. Notified her that the valium had been sent to her pharmacy as requested for the MRI.

## 2018-11-18 ENCOUNTER — Other Ambulatory Visit (INDEPENDENT_AMBULATORY_CARE_PROVIDER_SITE_OTHER): Payer: Self-pay | Admitting: Orthopedic Surgery

## 2018-11-18 ENCOUNTER — Telehealth (INDEPENDENT_AMBULATORY_CARE_PROVIDER_SITE_OTHER): Payer: Self-pay | Admitting: Orthopaedic Surgery

## 2018-11-18 MED ORDER — DIAZEPAM 5 MG PO TABS: ORAL_TABLET | ORAL | 0 refills | Status: AC

## 2018-11-18 NOTE — Telephone Encounter (Signed)
Please check on this. Thank you. 

## 2018-11-18 NOTE — Telephone Encounter (Signed)
Called and spoke with patient. The valium script has been called into her pharmacy, because for some reason the pharmacy did not get it electronically. She is aware and will pick it up tomorrow.

## 2018-11-18 NOTE — Telephone Encounter (Signed)
Patient called stating she went to [facility] today to pick up her prescription of Valium (1 tablet to take prior to scan) and patient was told they haven't received the prescription. Patient requested a return call.

## 2018-11-18 NOTE — Telephone Encounter (Signed)
Please resend. Thanks.

## 2018-11-21 ENCOUNTER — Ambulatory Visit
Admission: RE | Admit: 2018-11-21 | Discharge: 2018-11-21 | Disposition: A | Discharge status: Home / Self Care | Payer: Medicare Other | Source: Ambulatory Visit | Attending: Orthopaedic Surgery | Admitting: Orthopaedic Surgery

## 2018-11-21 ENCOUNTER — Other Ambulatory Visit: Payer: Self-pay

## 2018-11-21 DIAGNOSIS — G8929 Other chronic pain: Secondary | ICD-10-CM

## 2018-11-21 DIAGNOSIS — M25512 Pain in left shoulder: Principal | ICD-10-CM

## 2018-11-22 ENCOUNTER — Encounter (INDEPENDENT_AMBULATORY_CARE_PROVIDER_SITE_OTHER): Payer: Self-pay | Admitting: Orthopaedic Surgery

## 2018-11-22 ENCOUNTER — Ambulatory Visit (INDEPENDENT_AMBULATORY_CARE_PROVIDER_SITE_OTHER): Payer: Medicare Other | Admitting: Orthopaedic Surgery

## 2018-11-22 VITALS — BP 155/103 | HR 85 | Ht 62.0 in | Wt 240.0 lb

## 2018-11-22 DIAGNOSIS — G8929 Other chronic pain: Secondary | ICD-10-CM

## 2018-11-22 DIAGNOSIS — M25512 Pain in left shoulder: Secondary | ICD-10-CM | POA: Diagnosis not present

## 2018-11-22 NOTE — Progress Notes (Signed)
Office Visit Note   Patient: Shannon Delacruz           Date of Birth: 02-28-1961           MRN: [id number] Visit Date: 11/22/2018              Requested by: Neale Burly, [place],  [id number] PCP: Neale Burly, MD   Assessment & Plan: Visit Diagnoses:  1. Chronic left shoulder pain     Plan: MRI scan reveals partial tearing of the articular surface of the supraspinatus.  The other rotator cuff muscles appear to be intact.  Mild degenerative changes at the acromioclavicular joint.  Type I acromium.  Symptoms are consistent with impingement.  Long discussion with her regarding the MRI scan findings.  Shannon Delacruz would like to pursue a nonoperative treatment to include exercises and over-the-counter medicines.  At this point she is somewhat hesitant to consider cortisone injection.  Have discussed arthroscopic SCD and DCR.  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  We will see her back as needed  Follow-Up Instructions: Return if symptoms worsen or fail to improve.   Orders:  No orders of the defined types were placed in this encounter.  No orders of the defined types were placed in this encounter.     Procedures: No procedures performed   Clinical Data: No additional findings.   Subjective: Chief Complaint  Patient presents with   Left Shoulder - Follow-up  Patient presents today for follow up on her left shoulder. She had an MRI on 11/21/2018. No change in her shoulder. She is here today for results.  HPI  Review of Systems   Objective: Vital Signs: BP (!) 155/103    Pulse 85    Ht 5\' 2"  (1.575 m)    Wt 240 lb (108.9 kg)    BMI 43.90 kg/m   Physical Exam Constitutional:      Appearance: She is well-developed.  Eyes:     Pupils: Pupils are equal, round, and reactive to light.  Pulmonary:     Effort: Pulmonary effort is normal.  Skin:     General: Skin is warm and dry.  Neurological:     Mental Status: She is alert and oriented to person, place, and time.  Psychiatric:        Behavior: Behavior normal.     Ortho Exam awake alert and oriented x3.  Comfortable sitting.  Lacks just a few degrees to full overhead motion.  Can abduct quickly 90 degrees and can touch the lower part of her back but not the middle of her back.  She did have positive impingement.  Minimally positive empty can testing.  Biceps intact vascular exam intact.  No pain referred to the right shoulder with motion of the cervical spine  Specialty Comments:  No specialty comments available.  Imaging: Mr Shoulder Left W/o Contrast  Result Date: 11/21/2018 CLINICAL DATA:  Four month history of shoulder pain and limited range of motion. No known injury. EXAM: MRI OF THE LEFT SHOULDER WITHOUT CONTRAST TECHNIQUE: Multiplanar, multisequence MR imaging of the shoulder was performed. No intravenous contrast was administered. COMPARISON:  None. FINDINGS: Exam is somewhat limited by patient motion. Rotator cuff: Significant supraspinatus and infraspinatus tendinopathy/tendinosis. Fairly extensive articular surface tearing involving the supraspinatus tendon with up to 19 mm of laminar retraction of some of the articular fibers. Anteriorly the tear is  fairly deep going to but not definitely through the bursal fibers. The subscapularis tendon is intact. Muscles:  Normal. Biceps long head:  Intact. Acromioclavicular Joint: Minimal/mild degenerative changes. Type 1 acromion. No significant lateral downsloping or undersurface spurring. Glenohumeral Joint: No significant degenerative changes. Small joint effusion without synovitis. Labrum:  No labral tears. Bones:  No acute bony findings. Other: Mild subacromial/subdeltoid bursitis. IMPRESSION: 1. Significant supraspinatus tendinopathy/tendinosis. Fairly extensive articular surface tearing. No discrete full-thickness tear but probably  at risk for such. 2. No significant MR findings for bony impingement. 3. Intact long head biceps tendon and glenoid labrum. 4. Mild subacromial/subdeltoid bursitis. Electronically Signed   By: Marijo Sanes M.D.   On: 11/21/2018 16:10     PMFS History: There are no active problems to display for this patient.  Past Medical History:  Diagnosis Date   Cancer [facility])    UTERINE   DDD (degenerative disc disease), lumbar    Osteoarthritis     History reviewed. No pertinent family history.  Past Surgical History:  Procedure Laterality Date   ABDOMINAL HYSTERECTOMY     APPENDECTOMY     BACK SURGERY  2015   HIP SURGERY     Social History   Occupational History   Not on file  Tobacco Use   Smoking status: Former Smoker    Packs/day: 1.00    Years: 10.00    Pack years: 10.00    Types: Cigarettes    Last attempt to quit: 08/03/1993    Years since quitting: 25.3   Smokeless tobacco: Never Used  Substance and Sexual Activity   Alcohol use: Yes    Comment: OCC   Drug use: Not Currently   Sexual activity: Not on file

## 2018-11-24 ENCOUNTER — Ambulatory Visit (INDEPENDENT_AMBULATORY_CARE_PROVIDER_SITE_OTHER): Payer: Medicare Other | Admitting: Orthopaedic Surgery

## 2018-11-24 ENCOUNTER — Telehealth: Payer: Self-pay | Admitting: Orthopaedic Surgery

## 2018-11-24 NOTE — Telephone Encounter (Signed)
Patient wants to get a copy, report of Rt Shoulder Arthrogram. Patient has copy of Lt Shoulder. Please all when ready to pick up.

## 2018-11-25 NOTE — Telephone Encounter (Signed)
Called and spoke with patient.  Left MRI up front at check in for patient to pick up.

## 2019-01-19 ENCOUNTER — Other Ambulatory Visit (INDEPENDENT_AMBULATORY_CARE_PROVIDER_SITE_OTHER): Payer: Self-pay | Admitting: Orthopaedic Surgery

## 2019-01-19 NOTE — Telephone Encounter (Signed)
ok 

## 2019-03-15 ENCOUNTER — Other Ambulatory Visit: Payer: Self-pay

## 2019-03-15 ENCOUNTER — Ambulatory Visit: Payer: Medicare Other | Attending: Orthopedic Surgery | Admitting: Physical Therapy

## 2019-03-15 ENCOUNTER — Encounter: Payer: Self-pay | Admitting: Physical Therapy

## 2019-03-15 DIAGNOSIS — M25512 Pain in left shoulder: Secondary | ICD-10-CM | POA: Insufficient documentation

## 2019-03-15 DIAGNOSIS — R29898 Other symptoms and signs involving the musculoskeletal system: Secondary | ICD-10-CM | POA: Diagnosis present

## 2019-03-15 DIAGNOSIS — M25612 Stiffness of left shoulder, not elsewhere classified: Secondary | ICD-10-CM | POA: Diagnosis present

## 2019-03-15 DIAGNOSIS — M6281 Muscle weakness (generalized): Secondary | ICD-10-CM | POA: Diagnosis present

## 2019-03-15 NOTE — Therapy (Signed)
[facility] [address]  [place], [place], [address] Phone: [phone]   Fax:  [phone]  Physical Therapy Evaluation  Patient Details  Name: Shannon Delacruz MRN: [id number] Date of Birth: 04/08/1961 Referring Provider (PT): Vonna Drafts, MD   Encounter Date: 03/15/2019  PT End of Session - 03/15/19 1629    Visit Number  1    Number of Visits  13    Date for PT Re-Evaluation  04/26/19    Authorization Type  [employer]HC Medicare & Medicaid    PT Start Time  1441    PT Stop Time  1531    PT Time Calculation (min)  50 min    Equipment Utilized During Treatment  Other (comment)   L shoulder sling   Activity Tolerance  Patient tolerated treatment well;Patient limited by pain    Behavior During Therapy  [facility] for tasks assessed/performed       Past Medical History:  Diagnosis Date  . Cancer ([place])    UTERINE  . DDD (degenerative disc disease), lumbar   . Osteoarthritis     Past Surgical History:  Procedure Laterality Date  . ABDOMINAL HYSTERECTOMY    . APPENDECTOMY    . BACK SURGERY  2015  . HIP SURGERY      There were no vitals filed for this visit.   Subjective Assessment - 03/15/19 1443    Subjective  Patient reports undergoing L biceps tenodesis and RTC repair on 03/03/19. Reports manageable pain levels since surgery, but has difficulty with sleeping.  Wearing sling during the day but not at night and believes that is aggravating her pain levels. Had delayed healing of L shoulder incisions- admits to putting essential oils over the incisions. Planning on riding on motorcycle- says she has been cleared for this by MD. Has been icing it d/t swelling. Has been avoiding reaching overhead.    Pertinent History  OA, lumbar DDD, uterine CA, hip surgery, back surgery    Limitations  Reading;Lifting;House hold activities    Patient Stated Goals  be able to put L arm behind back    Currently in Pain?  Yes    Pain Score  8      Pain Location  Shoulder    Pain Orientation  Left    Pain Descriptors / Indicators  Aching    Pain Type  Acute pain;Surgical pain         [facility] PT Assessment - 03/15/19 1458      Assessment   Medical Diagnosis  L Unspecified RTC tear or rupture of L shoulder, not specified as traumatic    Referring Provider (PT)  Vonna Drafts, MD    Onset Date/Surgical Date  03/03/19    Hand Dominance  Right    Prior Therapy  Yes      Precautions   Precautions  --   L arm in sling; no WBing, AROM, behind the back movement     Balance Screen   Has the patient fallen in the past 6 months  No    Has the patient had a decrease in activity level because of a fear of falling?   No    Is the patient reluctant to leave their home because of a fear of falling?   No      Home Environment   Living Environment  Private residence    Available Help at Discharge  Friend(s)      Prior Function  Level of Independence  Independent    Vocation  On disability    Leisure  swimming, stationary bike      Cognition   Overall Cognitive Status  Within Functional Limits for tasks assessed      Observation/Other Assessments   Observations  L biceps mild bruising; L shoulder incisions covered in steri strips    Focus on Therapeutic Outcomes (FOTO)   Shoulder: 32 (68% limited, 37% predicted)      Sensation   Light Touch  Appears Intact      Coordination   Gross Motor Movements are Fluid and Coordinated  Yes      Posture/Postural Control   Posture/Postural Control  Postural limitations    Postural Limitations  Rounded Shoulders;Posterior pelvic tilt      ROM / Strength   AROM / PROM / Strength  PROM;AROM;Strength      AROM   AROM Assessment Site  Shoulder    Right/Left Shoulder  Right    Right Shoulder Flexion  130 Degrees   painful pop   Right Shoulder ABduction  110 Degrees    Right Shoulder Internal Rotation  --   FIR T10   Right Shoulder External Rotation  --   FER TI     PROM   PROM  Assessment Site  Shoulder    Right/Left Shoulder  Left    Left Shoulder Flexion  54 Degrees    Left Shoulder ABduction  74 Degrees   scaption/abd   Left Shoulder Internal Rotation  --   to belly   Left Shoulder External Rotation  13 Degrees      Strength   Strength Assessment Site  Shoulder;Elbow    Right/Left Shoulder  Right;Left    Right Shoulder Flexion  4+/5    Right Shoulder ABduction  4+/5    Right Shoulder Internal Rotation  4/5    Right Shoulder External Rotation  4+/5      Palpation   Palpation comment  L shoulder diffusely sensitive to gentle palpation                Objective measurements completed on examination: See above findings.              PT Education - 03/15/19 1628    Education Details  prognosis, POC, HEP; edu on AM/PM sling use and post-surgical precautions    Person(s) Educated  Patient    Methods  Explanation;Demonstration;Tactile cues;Verbal cues;Handout    Comprehension  Verbalized understanding;Returned demonstration       PT Short Term Goals - 03/15/19 1637      PT SHORT TERM GOAL #1   Title  Patient to be independent with initial HEP.    Time  3    Period  Weeks    Status  New    Target Date  04/05/19        PT Long Term Goals - 03/15/19 1637      PT LONG TERM GOAL #1   Title  Patient to be independent with advanced HEP.    Time  6    Period  Weeks    Status  New    Target Date  04/26/19      PT LONG TERM GOAL #2   Title  Patient to demonstrate L shoulder AROM/PROM [facility] and without pain limiting.    Time  6    Period  Weeks    Status  New    Target Date  04/26/19  PT LONG TERM GOAL #3   Title  Patient to demonstrate L shoulder strength >=4+/5.    Time  6    Period  Weeks    Status  New    Target Date  04/26/19      PT LONG TERM GOAL #4   Title  Pt will be able to reach behind her back and to lift carry groceries with less than 1-2/10 pain.     Time  6    Period  Weeks    Status  New    Target  Date  04/26/19             Plan - 03/15/19 1630    Clinical Impression Statement  Patient is a 58y/o F presenting to OPPT with c/o L shoulder pain s/p L biceps tenodesis and arthroscopic RTC repair on 03/03/19. Reports tolerable pain levels during the day, but having difficulty sleeping and neck pain from sling use. Patient admits to poor compliance with sling in PM and use of essential oils over incisions- educated patient on surgical precautions and incision care. Patient today with L shoulder incisions covered with steri-strips, mild diffuse swelling and tenderness over shoulder, and limited and painful L shoulder PROM. Educated patient on gentle HEP- patient reported understanding. Would benefit from skilled PT services 2x/week for 6 weeks to address aforementioned impairments.    Personal Factors and Comorbidities  Age;Comorbidity 3+;Time since onset of injury/illness/exacerbation;Past/Current Experience;Fitness    Comorbidities  OA, lumbar DDD, uterine CA, hip surgery, back surgery    Examination-Activity Limitations  Bathing;Bed Mobility;Sleep;Caring for Others;Carry;Dressing;Transfers;Hygiene/Grooming;Lift;Reach Overhead    Examination-Participation Restrictions  Cleaning;Shop;Community Activity;Driving;Yard Work;Interpersonal Relationship;Laundry;Meal Prep    Stability/Clinical Decision Making  Stable/Uncomplicated    Clinical Decision Making  Low    Rehab Potential  Good    PT Frequency  2x / week    PT Duration  6 weeks    PT Treatment/Interventions  ADLs/Self Care Home Management;Cryotherapy;Electrical Stimulation;Moist Heat;Therapeutic exercise;Therapeutic activities;Functional mobility training;Ultrasound;Neuromuscular re-education;Manual techniques;Vasopneumatic Device;Taping;Splinting;Energy conservation;Dry needling;Passive range of motion;Scar mobilization    PT Next Visit Plan  reassess HEP    Consulted and Agree with Plan of Care  Patient       Patient will benefit from  skilled therapeutic intervention in order to improve the following deficits and impairments:  Hypomobility, Increased edema, Decreased scar mobility, Decreased knowledge of precautions, Decreased activity tolerance, Decreased strength, Impaired UE functional use, Pain, Decreased range of motion, Improper body mechanics, Postural dysfunction, Impaired flexibility  Visit Diagnosis: 1. Acute pain of left shoulder   2. Stiffness of left shoulder, not elsewhere classified   3. Muscle weakness (generalized)   4. Other symptoms and signs involving the musculoskeletal system        Problem List There are no active problems to display for this patient.    Janene Harvey, PT, DPT 03/15/19 4:41 PM   [facility] [address]  [place], [place], [address] Phone: [phone]   Fax:  [phone]  Name: Shannon Delacruz MRN: [id number] Date of Birth: [birth year]

## 2019-03-17 ENCOUNTER — Encounter: Payer: Self-pay | Admitting: Physical Therapy

## 2019-03-17 ENCOUNTER — Ambulatory Visit: Payer: Medicare Other | Admitting: Physical Therapy

## 2019-03-17 ENCOUNTER — Other Ambulatory Visit: Payer: Self-pay

## 2019-03-17 VITALS — BP 162/95

## 2019-03-17 DIAGNOSIS — M25612 Stiffness of left shoulder, not elsewhere classified: Secondary | ICD-10-CM

## 2019-03-17 DIAGNOSIS — R29898 Other symptoms and signs involving the musculoskeletal system: Secondary | ICD-10-CM

## 2019-03-17 DIAGNOSIS — M25512 Pain in left shoulder: Secondary | ICD-10-CM

## 2019-03-17 DIAGNOSIS — M6281 Muscle weakness (generalized): Secondary | ICD-10-CM

## 2019-03-17 NOTE — Therapy (Signed)
[facility] [address]  [place], [place], [address] Phone: [phone]   Fax:  [phone]  Physical Therapy Treatment  Patient Details  Name: Shannon Delacruz MRN: [id number] Date of Birth: Aug 05, 1961 Referring Provider (PT): Vonna Drafts, MD   Encounter Date: 03/17/2019  PT End of Session - 03/17/19 1020    Visit Number  2    Number of Visits  13    Date for PT Re-Evaluation  04/26/19    Authorization Type  UHC Medicare & Medicaid    PT Start Time  0932    PT Stop Time  1017    PT Time Calculation (min)  45 min    Equipment Utilized During Treatment  Other (comment)   L shoulder sling   Activity Tolerance  Patient tolerated treatment well;Patient limited by pain    Behavior During Therapy  [facility] for tasks assessed/performed       Past Medical History:  Diagnosis Date  . Cancer ([place])    UTERINE  . DDD (degenerative disc disease), lumbar   . Osteoarthritis     Past Surgical History:  Procedure Laterality Date  . ABDOMINAL HYSTERECTOMY    . APPENDECTOMY    . BACK SURGERY  2015  . HIP SURGERY      Vitals:   03/17/19 0933  BP: (!) 162/95    Subjective Assessment - 03/17/19 0933    Subjective  Patient bringing in photos of arthroscopic images of shoulder. Reports that this AM she was having more pain, raising her BP.    Pertinent History  OA, lumbar DDD, uterine CA, hip surgery, back surgery    Patient Stated Goals  be able to put L arm behind back    Currently in Pain?  Yes    Pain Score  5     Pain Location  Shoulder    Pain Orientation  Left    Pain Descriptors / Indicators  Aching    Pain Type  Acute pain;Surgical pain                       [facility] Adult PT Treatment/Exercise - 03/17/19 0001      Exercises   Exercises  Shoulder      Shoulder Exercises: Seated   Retraction  Strengthening;Both;10 reps    Retraction Limitations  10x3"      Shoulder Exercises: Standing   Other  Standing Exercises  L shoulder pendulum ant/pos, M/L, CW, CCW x30" each      Shoulder Exercises: Stretch   Other Shoulder Stretches  R & L LS stretch 30" to tolerance    Other Shoulder Stretches  R & L UT stretch 30" to tolerance      Modalities   Modalities  Vasopneumatic      Vasopneumatic   Number Minutes Vasopneumatic   10 minutes    Vasopnuematic Location   Shoulder   L   Vasopneumatic Pressure  Low    Vasopneumatic Temperature   40       Manual Therapy   Manual Therapy  Soft tissue mobilization;Passive ROM;Myofascial release    Manual therapy comments  supine with L shoulder elevated on pillow    Soft tissue mobilization  STM to L UT- palpable trigger points    Myofascial Release  manual TPR to L UT    Passive ROM  L elbow PROM into flexion, extension, pronation, supination; L shoulder PROM into flexion, scaption, IR, ER  to tolerance   most pain with IR and ER            PT Education - 03/17/19 1020    Education Details  update to HEP    Person(s) Educated  Patient    Methods  Explanation;Demonstration;Tactile cues;Verbal cues;Handout    Comprehension  Verbalized understanding;Returned demonstration       PT Short Term Goals - 03/17/19 1058      PT SHORT TERM GOAL #1   Title  Patient to be independent with initial HEP.    Time  3    Period  Weeks    Status  On-going    Target Date  04/05/19        PT Long Term Goals - 03/17/19 1058      PT LONG TERM GOAL #1   Title  Patient to be independent with advanced HEP.    Time  6    Period  Weeks    Status  On-going      PT LONG TERM GOAL #2   Title  Patient to demonstrate L shoulder AROM/PROM WFL and without pain limiting.    Time  6    Period  Weeks    Status  On-going      PT LONG TERM GOAL #3   Title  Patient to demonstrate L shoulder strength >=4+/5.    Time  6    Period  Weeks    Status  On-going      PT LONG TERM GOAL #4   Title  Pt will be able to reach behind her back and to lift carry  groceries with less than 1-2/10 pain.     Time  6    Period  Weeks    Status  On-going            Plan - 03/17/19 1021    Clinical Impression Statement  Patient arrived to session with report of increased L shoulder pain after sleeping in her bed vs recliner. Reports sleeping in sling now. Noted increased BP this AM. BP noted to be elevated at beginning of session. Reviewed HEP with cues to promote shoulder depression as patient with tendency to guard. Tolerated L elbow and shoulder PROM to tolerance- most pain with IR and ER. Palpable trigger points observed over UT which improved with STM. Ended session with [place] to L shoulder for pain relief. Updated HEP with exercises that were well-tolerated today- patient reported understanding. No complaints at end of session.    Comorbidities  OA, lumbar DDD, uterine CA, hip surgery, back surgery    PT Treatment/Interventions  ADLs/Self Care Home Management;Cryotherapy;Electrical Stimulation;Moist Heat;Therapeutic exercise;Therapeutic activities;Functional mobility training;Ultrasound;Neuromuscular re-education;Manual techniques;Vasopneumatic Device;Taping;Splinting;Energy conservation;Dry needling;Passive range of motion;Scar mobilization    PT Next Visit Plan  progress L shoulder PROM    Consulted and Agree with Plan of Care  Patient       Patient will benefit from skilled therapeutic intervention in order to improve the following deficits and impairments:  Hypomobility, Increased edema, Decreased scar mobility, Decreased knowledge of precautions, Decreased activity tolerance, Decreased strength, Impaired UE functional use, Pain, Decreased range of motion, Improper body mechanics, Postural dysfunction, Impaired flexibility  Visit Diagnosis: 1. Acute pain of left shoulder   2. Stiffness of left shoulder, not elsewhere classified   3. Muscle weakness (generalized)   4. Other symptoms and signs involving the musculoskeletal system         Problem List There are no active problems to display for  this patient.   Janene Harvey, PT, DPT 03/17/19 11:00 AM    [facility] [address]  [place], [place], [address] Phone: [phone]   Fax:  [phone]  Name: Nellene Courtois MRN: [id number] Date of Birth: [birth year]

## 2019-03-21 ENCOUNTER — Ambulatory Visit: Payer: Medicare Other | Admitting: Physical Therapy

## 2019-03-21 ENCOUNTER — Encounter: Payer: Self-pay | Admitting: Physical Therapy

## 2019-03-21 ENCOUNTER — Other Ambulatory Visit: Payer: Self-pay

## 2019-03-21 VITALS — BP 149/88 | HR 71

## 2019-03-21 DIAGNOSIS — M6281 Muscle weakness (generalized): Secondary | ICD-10-CM

## 2019-03-21 DIAGNOSIS — M25612 Stiffness of left shoulder, not elsewhere classified: Secondary | ICD-10-CM

## 2019-03-21 DIAGNOSIS — M25512 Pain in left shoulder: Secondary | ICD-10-CM

## 2019-03-21 DIAGNOSIS — R29898 Other symptoms and signs involving the musculoskeletal system: Secondary | ICD-10-CM
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
## 2019-03-23 ENCOUNTER — Ambulatory Visit: Payer: Medicare Other | Admitting: Physical Therapy

## 2019-03-23 ENCOUNTER — Encounter: Payer: Self-pay | Admitting: Physical Therapy

## 2019-03-23 ENCOUNTER — Other Ambulatory Visit: Payer: Self-pay

## 2019-03-23 DIAGNOSIS — M25512 Pain in left shoulder: Secondary | ICD-10-CM | POA: Diagnosis not present

## 2019-03-23 DIAGNOSIS — R29898 Other symptoms and signs involving the musculoskeletal system: Secondary | ICD-10-CM

## 2019-03-23 DIAGNOSIS — M6281 Muscle weakness (generalized): Secondary | ICD-10-CM

## 2019-03-23 DIAGNOSIS — M25612 Stiffness of left shoulder, not elsewhere classified: Secondary | ICD-10-CM

## 2019-03-23 NOTE — Therapy (Signed)
[facility] [address]  [place], [place], [address] Phone: [phone]   Fax:  [phone]  Physical Therapy Treatment  Patient Details  Name: Shannon Delacruz MRN: [id number] Date of Birth: 1961/01/30 Referring Provider (PT): Vonna Drafts, MD   Encounter Date: 03/23/2019  PT End of Session - 03/23/19 1020    Visit Number  4    Number of Visits  13    Date for PT Re-Evaluation  04/26/19    Authorization Type  [employer]HC Medicare & Medicaid    PT Start Time  0932    PT Stop Time  1017    PT Time Calculation (min)  45 min    Equipment Utilized During Treatment  Other (comment)   L shoulder sling   Activity Tolerance  Patient tolerated treatment well;Patient limited by pain    Behavior During Therapy  [facility] for tasks assessed/performed       Past Medical History:  Diagnosis Date  . Cancer ([place])    UTERINE  . DDD (degenerative disc disease), lumbar   . Osteoarthritis     Past Surgical History:  Procedure Laterality Date  . ABDOMINAL HYSTERECTOMY    . APPENDECTOMY    . BACK SURGERY  2015  . HIP SURGERY      There were no vitals filed for this visit.  Subjective Assessment - 03/23/19 0933    Subjective  Still having pain when she wakes up which lessens as the day goes on. Still having some neck pain. Reporting redness over 2 of her incisions- took pictures of them and MD instructed her to put neosporin and bandaids over the incisions.    Pertinent History  OA, lumbar DDD, uterine CA, hip surgery, back surgery    Patient Stated Goals  be able to put L arm behind back    Currently in Pain?  Yes    Pain Score  5     Pain Location  Shoulder    Pain Orientation  Left    Pain Descriptors / Indicators  Aching    Pain Type  Acute pain;Surgical pain                       [facility] Adult PT Treatment/Exercise - 03/23/19 0001      Exercises   Exercises  Wrist      Neck Exercises: Seated   Neck Retraction   10 reps;3 secs    Neck Retraction Limitations  2nd set with yellow band with PT holding L side;good form      Shoulder Exercises: Seated   Other Seated Exercises  B shoulder circles posterior x10, anterior x10      Shoulder Exercises: Standing   Other Standing Exercises  L shoulder pendulum ant/pos, M/L, CW, CCW x30" each      Wrist Exercises   Wrist Flexion  Strengthening;Left;10 reps;Seated    Wrist Flexion Limitations  elbow supported on table   cues for increased ROM   Wrist Extension  Strengthening;Left;10 reps;Seated    Wrist Extension Limitations  elbow supported on table      Manual Therapy   Manual Therapy  Soft tissue mobilization;Passive ROM;Myofascial release;Joint mobilization    Manual therapy comments  sitting and supine    Joint Mobilization  L shoulder anterior and posterior joint mobs grade II, gentle distraction during ROM grade II    Soft tissue mobilization  STM to R UT and LS- TTP and palpable trigger  points in UT    Myofascial Release  manual TPR to R UT    Passive ROM  L shoulder PROM to tolerance in all directions with prolonged holds at end range   most painful in IR/ER            PT Education - 03/23/19 1020    Education Details  update to HEP    Person(s) Educated  Patient    Methods  Explanation;Demonstration;Verbal cues;Tactile cues;Handout    Comprehension  Verbalized understanding;Returned demonstration       PT Short Term Goals - 03/21/19 1136      PT SHORT TERM GOAL #1   Title  Patient to be independent with initial HEP.    Time  3    Period  Weeks    Status  Achieved    Target Date  04/05/19        PT Long Term Goals - 03/17/19 1058      PT LONG TERM GOAL #1   Title  Patient to be independent with advanced HEP.    Time  6    Period  Weeks    Status  On-going      PT LONG TERM GOAL #2   Title  Patient to demonstrate L shoulder AROM/PROM WFL and without pain limiting.    Time  6    Period  Weeks    Status  On-going       PT LONG TERM GOAL #3   Title  Patient to demonstrate L shoulder strength >=4+/5.    Time  6    Period  Weeks    Status  On-going      PT LONG TERM GOAL #4   Title  Pt will be able to reach behind her back and to lift carry groceries with less than 1-2/10 pain.     Time  6    Period  Weeks    Status  On-going            Plan - 03/23/19 1021    Clinical Impression Statement  Patient arrived to session with persisting achiness in L shoulder and neck. Having trouble sleeping. Introduced wrist AROM with L shoulder supported- patient reporting difficulty with wrist flexion ROM. Updated HEP with distal joint ROM and grip strengthening. Patient reported understanding. Able to tolerate cervical retractions with light banded resistance. Manual therapy focused on gentle L shoulder joint mobilizations and PROM in scapular plane. Patient showing good improvements in flexion and scaption PROM; still most limited and painful in IR and PROM. Ended session with STM and manual TPR to R UT, LS, and scalenes for pain relief. Patient reported relief at end of session.    Comorbidities  OA, lumbar DDD, uterine CA, hip surgery, back surgery    PT Treatment/Interventions  ADLs/Self Care Home Management;Cryotherapy;Electrical Stimulation;Moist Heat;Therapeutic exercise;Therapeutic activities;Functional mobility training;Ultrasound;Neuromuscular re-education;Manual techniques;Vasopneumatic Device;Taping;Splinting;Energy conservation;Dry needling;Passive range of motion;Scar mobilization    PT Next Visit Plan  progress L shoulder PROM    Consulted and Agree with Plan of Care  Patient       Patient will benefit from skilled therapeutic intervention in order to improve the following deficits and impairments:  Hypomobility, Increased edema, Decreased scar mobility, Decreased knowledge of precautions, Decreased activity tolerance, Decreased strength, Impaired UE functional use, Pain, Decreased range of motion,  Improper body mechanics, Postural dysfunction, Impaired flexibility  Visit Diagnosis: 1. Acute pain of left shoulder   2. Stiffness of left shoulder, not elsewhere classified  3. Muscle weakness (generalized)   4. Other symptoms and signs involving the musculoskeletal system        Problem List There are no active problems to display for this patient.    Janene Harvey, PT, DPT 03/23/19 10:25 AM   [facility] [place], [place], [address] Phone: [phone]   Fax:  [phone]  Name: Shannon Delacruz MRN: [id number] Date of Birth: 1961-03-05

## 2019-03-29 ENCOUNTER — Encounter: Payer: Self-pay | Admitting: Physical Therapy

## 2019-03-29 ENCOUNTER — Ambulatory Visit: Payer: Medicare Other | Admitting: Physical Therapy

## 2019-03-29 ENCOUNTER — Other Ambulatory Visit: Payer: Self-pay

## 2019-03-29 VITALS — BP 154/82 | HR 74

## 2019-03-29 DIAGNOSIS — M25512 Pain in left shoulder: Secondary | ICD-10-CM | POA: Diagnosis not present

## 2019-03-29 DIAGNOSIS — M6281 Muscle weakness (generalized): Secondary | ICD-10-CM

## 2019-03-29 DIAGNOSIS — M25612 Stiffness of left shoulder, not elsewhere classified: Secondary | ICD-10-CM

## 2019-03-29 DIAGNOSIS — R29898 Other symptoms and signs involving the musculoskeletal system: Secondary | ICD-10-CM

## 2019-03-29 NOTE — Therapy (Signed)
[facility] [address]  [place], [place], [address] Phone: [phone]   Fax:  [phone]  Physical Therapy Treatment  Patient Details  Name: Shannon Delacruz MRN: [id number] Date of Birth: Jun 25, 1961 Referring Provider (PT): Vonna Drafts, MD   Encounter Date: 03/29/2019  PT End of Session - 03/29/19 1200    Visit Number  5    Number of Visits  13    Date for PT Re-Evaluation  04/26/19    Authorization Type  [employer]HC Medicare & Medicaid    PT Start Time  0929    PT Stop Time  1020    PT Time Calculation (min)  51 min    Equipment Utilized During Treatment  Other (comment)   L shoulder sling   Activity Tolerance  Patient tolerated treatment well;Patient limited by pain    Behavior During Therapy  [facility] for tasks assessed/performed       Past Medical History:  Diagnosis Date  . Cancer ([place])    UTERINE  . DDD (degenerative disc disease), lumbar   . Osteoarthritis     Past Surgical History:  Procedure Laterality Date  . ABDOMINAL HYSTERECTOMY    . APPENDECTOMY    . BACK SURGERY  2015  . HIP SURGERY      Vitals:   03/29/19 0929  BP: (!) 154/82  Pulse: 74  SpO2: 98%    Subjective Assessment - 03/29/19 0929    Subjective  Reports that she texted her MD to ask about clearance to drive and to D/C sling. Feels like she has been trying to reach forward with the L arm when performing elbow PROM. Has a family emergency in [place] and will be gone next week. Patient recalls an event over the weekend when she jerked her arm trying to get away from a hornet. Also admits to trying to use her L arm to do her hair.    Pertinent History  OA, lumbar DDD, uterine CA, hip surgery, back surgery    Patient Stated Goals  be able to put L arm behind back    Currently in Pain?  Yes    Pain Score  7     Pain Location  Shoulder    Pain Orientation  Left    Pain Type  Acute pain;Surgical pain                        [facility] Adult PT Treatment/Exercise - 03/29/19 0001      Shoulder Exercises: Supine   Flexion  PROM;Left;5 reps    Flexion Limitations  self-PROM within limited ROM to tolerance with pillow underneath arm      Shoulder Exercises: Standing   Other Standing Exercises  L shoulder pendulum ant/pos, M/L, CW, CCW x30" each      Wrist Exercises   Wrist Flexion  Strengthening;Left;10 reps;Seated    Wrist Flexion Limitations  1# 2x10; elbow supported on table    Wrist Extension  Strengthening;Left;10 reps;Seated    Wrist Extension Limitations  1# 2x10; elbow supported on table      Vasopneumatic   Number Minutes Vasopneumatic   10 minutes    Vasopnuematic Location   Shoulder   L   Vasopneumatic Pressure  Low    Vasopneumatic Temperature   40       Manual Therapy   Manual Therapy  Soft tissue mobilization;Passive ROM;Myofascial release;Joint mobilization    Manual therapy comments  supine  Joint Mobilization  L shoulder gentle distraction grade III to increase tolerance for PROM    Soft tissue mobilization  STM to L bicep and pec- very tender and with palpable trigger points in proximal bicep    Myofascial Release  manual TPR to L bicep    Passive ROM  L shoulder PROM to tolerance in all directions with prolonged holds at end range             PT Education - 03/29/19 1159    Education Details  update to HEP and instructed to use plastic water bottle as weight with wrist flexion/extension    Person(s) Educated  Patient    Methods  Explanation;Demonstration;Tactile cues;Verbal cues;Handout    Comprehension  Verbalized understanding;Returned demonstration       PT Short Term Goals - 03/21/19 1136      PT SHORT TERM GOAL #1   Title  Patient to be independent with initial HEP.    Time  3    Period  Weeks    Status  Achieved    Target Date  04/05/19        PT Long Term Goals - 03/17/19 1058      PT LONG TERM GOAL #1   Title  Patient to  be independent with advanced HEP.    Time  6    Period  Weeks    Status  On-going      PT LONG TERM GOAL #2   Title  Patient to demonstrate L shoulder AROM/PROM WFL and without pain limiting.    Time  6    Period  Weeks    Status  On-going      PT LONG TERM GOAL #3   Title  Patient to demonstrate L shoulder strength >=4+/5.    Time  6    Period  Weeks    Status  On-going      PT LONG TERM GOAL #4   Title  Pt will be able to reach behind her back and to lift carry groceries with less than 1-2/10 pain.     Time  6    Period  Weeks    Status  On-going            Plan - 03/29/19 1202    Clinical Impression Statement  Patient arrived to session with report of increased pain over the past couple of days. Admits to trying to reach overhead with L arm to do her hair and recalls event over the weekend when she jerked the L arm away from a hornet. Believes flare up of pain may be attributed to this. Reminded patient of post-surgical precautions and advised her to avoid AROM. Patient reported understanding. Tolerated increased weighted resistance with wrist flexion and extension with elbow supported with good tolerance. Patient with severable palpable trigger points in L proximal biceps and with pain in pec with STM today. Tolerated L shoulder PROM within pain-free ranges. Patient still most limited in IR and ER d/t pain- comfort improved slightly with gentle distraction. Patient able to perform self-PROM L shoulder flexion within limited range. Updated HEP to include this exercise. Ended session with Gameready to L shoulder for pain relief. Patient without complaints at end of session.    Comorbidities  OA, lumbar DDD, uterine CA, hip surgery, back surgery    PT Treatment/Interventions  ADLs/Self Care Home Management;Cryotherapy;Electrical Stimulation;Moist Heat;Therapeutic exercise;Therapeutic activities;Functional mobility training;Ultrasound;Neuromuscular re-education;Manual  techniques;Vasopneumatic Device;Taping;Splinting;Energy conservation;Dry needling;Passive range of motion;Scar mobilization    PT  Next Visit Plan  progress L shoulder PROM    Consulted and Agree with Plan of Care  Patient       Patient will benefit from skilled therapeutic intervention in order to improve the following deficits and impairments:  Hypomobility, Increased edema, Decreased scar mobility, Decreased knowledge of precautions, Decreased activity tolerance, Decreased strength, Impaired UE functional use, Pain, Decreased range of motion, Improper body mechanics, Postural dysfunction, Impaired flexibility  Visit Diagnosis: 1. Acute pain of left shoulder   2. Stiffness of left shoulder, not elsewhere classified   3. Muscle weakness (generalized)   4. Other symptoms and signs involving the musculoskeletal system        Problem List There are no active problems to display for this patient.    Janene Harvey, PT, DPT 03/29/19 12:10 PM   [facility] [address]  [place], [place], [address] Phone: [phone]   Fax:  [phone]  Name: Agueda Houpt MRN: [id number] Date of Birth: 05-17-1961

## 2019-03-31 ENCOUNTER — Encounter: Payer: Self-pay | Admitting: Physical Therapy

## 2019-03-31 ENCOUNTER — Ambulatory Visit: Payer: Medicare Other | Admitting: Physical Therapy

## 2019-03-31 ENCOUNTER — Other Ambulatory Visit: Payer: Self-pay

## 2019-03-31 DIAGNOSIS — M25512 Pain in left shoulder: Secondary | ICD-10-CM | POA: Diagnosis not present

## 2019-03-31 DIAGNOSIS — M25612 Stiffness of left shoulder, not elsewhere classified: Secondary | ICD-10-CM

## 2019-03-31 DIAGNOSIS — R29898 Other symptoms and signs involving the musculoskeletal system: Secondary | ICD-10-CM

## 2019-03-31 DIAGNOSIS — M6281 Muscle weakness (generalized): Secondary | ICD-10-CM

## 2019-03-31 NOTE — Therapy (Signed)
[facility] [address]  [place], [place], [address] Phone: [phone]   Fax:  [phone]  Physical Therapy Treatment  Patient Details  Name: Shannon Delacruz MRN: [id number] Date of Birth: 04/25/1961 Referring Provider (PT): Vonna Drafts, MD   Encounter Date: 03/31/2019  PT End of Session - 03/31/19 1200    Visit Number  6    Number of Visits  13    Date for PT Re-Evaluation  04/26/19    Authorization Type  [employer] Medicare & Medicaid    PT Start Time  1017    PT Stop Time  1100    PT Time Calculation (min)  43 min    Equipment Utilized During Treatment  Other (comment)   L shoulder sling   Activity Tolerance  Patient tolerated treatment well;Patient limited by pain    Behavior During Therapy  [facility] for tasks assessed/performed       Past Medical History:  Diagnosis Date  . Cancer ([place])    UTERINE  . DDD (degenerative disc disease), lumbar   . Osteoarthritis     Past Surgical History:  Procedure Laterality Date  . ABDOMINAL HYSTERECTOMY    . APPENDECTOMY    . BACK SURGERY  2015  . HIP SURGERY      There were no vitals filed for this visit.  Subjective Assessment - 03/31/19 1017    Subjective  Reports "every time you work on me it seems to get better." Jerked her L arm d/t a dog jumping on her lap this AM.    Pertinent History  OA, lumbar DDD, uterine CA, hip surgery, back surgery    Patient Stated Goals  be able to put L arm behind back    Currently in Pain?  Yes    Pain Score  6     Pain Location  Shoulder    Pain Orientation  Left    Pain Descriptors / Indicators  Aching    Pain Type  Acute pain;Surgical pain                       [facility] Adult PT Treatment/Exercise - 03/31/19 0001      Elbow Exercises   Elbow Flexion  Left;10 reps;Seated   L elbow flexion/extension with shoulder in neutral 2x10     Shoulder Exercises: Supine   Flexion  PROM;Left;5 reps    Flexion Limitations   self-PROM to tolerance with folded pillow underneath arm   visible improvement in tolerance   Other Supine Exercises  L shoulder AAROM flexion with wand x5 to tolerance with folded pillow under arm      Shoulder Exercises: Seated   Other Seated Exercises  L elbow pronation/supination AROM with towel roll under elbow and shoulder in neutral 2x10      Shoulder Exercises: Standing   Other Standing Exercises  L shoulder pendulum ant/pos, M/L, CW, CCW x30" each      Manual Therapy   Manual Therapy  Passive ROM;Joint mobilization;Soft tissue mobilization;Myofascial release    Manual therapy comments  supine    Joint Mobilization  L shoulder anterior, posterior, inferior mobs , and gentle distraction grade III to increase tolerance for PROM    Soft tissue mobilization  STM to R UT, scalenes, LS    Myofascial Release  manual TPR to R UT & LS- palpable increase in tone and tenderness throughout    Passive ROM  L shoulder PROM to tolerance  in all directions with prolonged holds at end range             PT Education - 03/31/19 1200    Education Details  update to HEP    Person(s) Educated  Patient    Methods  Explanation;Demonstration;Tactile cues;Verbal cues;Handout    Comprehension  Verbalized understanding;Returned demonstration       PT Short Term Goals - 03/21/19 1136      PT SHORT TERM GOAL #1   Title  Patient to be independent with initial HEP.    Time  3    Period  Weeks    Status  Achieved    Target Date  04/05/19        PT Long Term Goals - 03/17/19 1058      PT LONG TERM GOAL #1   Title  Patient to be independent with advanced HEP.    Time  6    Period  Weeks    Status  On-going      PT LONG TERM GOAL #2   Title  Patient to demonstrate L shoulder AROM/PROM WFL and without pain limiting.    Time  6    Period  Weeks    Status  On-going      PT LONG TERM GOAL #3   Title  Patient to demonstrate L shoulder strength >=4+/5.    Time  6    Period  Weeks     Status  On-going      PT LONG TERM GOAL #4   Title  Pt will be able to reach behind her back and to lift carry groceries with less than 1-2/10 pain.     Time  6    Period  Weeks    Status  On-going            Plan - 03/31/19 1200    Clinical Impression Statement  Patient reporting improvement in L shoulder pain after each session. Continues to report small flare ups of pain from jerking and sudden unintended movements with the L arm. Initiated L elbow AROM with shoulder in neutral positioning; patient tolerated this well. Introduced L shoulder flexion AAROM with wand to patient's tolerance. Patient also demonstrating improved tolerance for self-PROM flexion. Ended session with STM and manual TPR to R UT, LS, and scalenes as patient still presents with soft tissue restriction and pain in these areas. Reported relief after manual therapy. Updated HEP with exercises that were well-tolerated today. Patient reported understanding and with no complaints at end of session.    Comorbidities  OA, lumbar DDD, uterine CA, hip surgery, back surgery    PT Treatment/Interventions  ADLs/Self Care Home Management;Cryotherapy;Electrical Stimulation;Moist Heat;Therapeutic exercise;Therapeutic activities;Functional mobility training;Ultrasound;Neuromuscular re-education;Manual techniques;Vasopneumatic Device;Taping;Splinting;Energy conservation;Dry needling;Passive range of motion;Scar mobilization    PT Next Visit Plan  progress L shoulder PROM/AAROM    Consulted and Agree with Plan of Care  Patient       Patient will benefit from skilled therapeutic intervention in order to improve the following deficits and impairments:  Hypomobility, Increased edema, Decreased scar mobility, Decreased knowledge of precautions, Decreased activity tolerance, Decreased strength, Impaired UE functional use, Pain, Decreased range of motion, Improper body mechanics, Postural dysfunction, Impaired flexibility  Visit  Diagnosis: 1. Acute pain of left shoulder   2. Stiffness of left shoulder, not elsewhere classified   3. Muscle weakness (generalized)   4. Other symptoms and signs involving the musculoskeletal system        Problem List There  are no active problems to display for this patient.   Janene Harvey, PT, DPT 03/31/19 12:06 PM   [facility] [address]  [place], [place], [address] Phone: [phone]   Fax:  [phone]  Name: Shannon Delacruz MRN: [id number] Date of Birth: 12-29-1960

## 2019-04-04 ENCOUNTER — Ambulatory Visit: Payer: Medicare Other | Admitting: Physical Therapy

## 2019-04-06 ENCOUNTER — Encounter: Payer: Medicare Other | Admitting: Physical Therapy

## 2019-04-11 ENCOUNTER — Ambulatory Visit: Payer: Medicare Other | Attending: Orthopedic Surgery | Admitting: Physical Therapy

## 2019-04-11 ENCOUNTER — Encounter: Payer: Self-pay | Admitting: Physical Therapy

## 2019-04-11 ENCOUNTER — Other Ambulatory Visit: Payer: Self-pay

## 2019-04-11 VITALS — BP 140/100

## 2019-04-11 DIAGNOSIS — M6281 Muscle weakness (generalized): Secondary | ICD-10-CM | POA: Diagnosis present

## 2019-04-11 DIAGNOSIS — M25512 Pain in left shoulder: Secondary | ICD-10-CM | POA: Diagnosis present

## 2019-04-11 DIAGNOSIS — R29898 Other symptoms and signs involving the musculoskeletal system: Secondary | ICD-10-CM

## 2019-04-11 DIAGNOSIS — M25612 Stiffness of left shoulder, not elsewhere classified: Secondary | ICD-10-CM | POA: Insufficient documentation

## 2019-04-11 NOTE — Therapy (Signed)
[facility] [address]  [place], [place], [address] Phone: [phone]   Fax:  [phone]  Physical Therapy Treatment  Patient Details  Name: Shannon Delacruz MRN: [id number] Date of Birth: [birth year] Referring Provider (PT): Vonna Drafts, MD   Encounter Date: 04/11/2019  PT End of Session - 04/11/19 1155    Visit Number  7    Number of Visits  13    Date for PT Re-Evaluation  04/26/19    Authorization Type  [employer] Medicare & Medicaid    PT Start Time  1018    PT Stop Time  1102    PT Time Calculation (min)  44 min    Activity Tolerance  Patient tolerated treatment well;Patient limited by pain    Behavior During Therapy  [facility] for tasks assessed/performed       Past Medical History:  Diagnosis Date  . Cancer ([place])    UTERINE  . DDD (degenerative disc disease), lumbar   . Osteoarthritis     Past Surgical History:  Procedure Laterality Date  . ABDOMINAL HYSTERECTOMY    . APPENDECTOMY    . BACK SURGERY  2015  . HIP SURGERY      Vitals:   04/11/19 1018  BP: (!) 140/100    Subjective Assessment - 04/11/19 1018    Subjective  Presents out of sling. Having trouble reaching with the L arm. Went to a water park on vacation and had to save her neice from falling through an inner tube, requiring her to pick her up with both hands. had some soreness later.    Pertinent History  OA, lumbar DDD, uterine CA, hip surgery, back surgery    Patient Stated Goals  be able to put L arm behind back    Currently in Pain?  Yes    Pain Score  5     Pain Location  Shoulder    Pain Orientation  Left    Pain Descriptors / Indicators  Aching    Pain Type  Acute pain;Surgical pain                       [facility] Adult PT Treatment/Exercise - 04/11/19 0001      Exercises   Exercises  Elbow      Elbow Exercises   Elbow Flexion  Left;10 reps;Seated;AROM   flexion/extension AROM with PT OP   Forearm Supination   AROM;Left;10 reps;Seated    Forearm Supination Limitations  10x3"; L elbow bent to 90 deg with shoulder at neutral    Forearm Pronation  AROM;Left;10 reps;Seated    Forearm Pronation Limitations  10x3"; L elbow bent to 90 deg with shoulder at neutral      Shoulder Exercises: Supine   External Rotation  AAROM;Left;10 reps    External Rotation Limitations  with wand to tolerance and towel roll under elbow for neutral positioning    Internal Rotation  AAROM;Left;10 reps    Internal Rotation Limitations  with wand to tolerance and towel roll under elbow for neutral positioning    Flexion  AAROM;Left;10 reps    Flexion Limitations  with wand to tolerance      Shoulder Exercises: Standing   Extension  AROM;Left;10 reps    Extension Limitations  10x3"; L elbow bent to 90 deg with shoulder at neutral    Row  AROM;Left;10 reps    Row Limitations  leaning over counter top; stopping at neutral  cues to avoid shoulder hike   Other Standing Exercises  L shoulder pendulum ant/pos, M/L, CW, CCW x30" each      Manual Therapy   Manual Therapy  Passive ROM;Joint mobilization;Soft tissue mobilization;Myofascial release    Manual therapy comments  supine    Joint Mobilization  L shoulder anterior, posterior, inferior mobs , and gentle distraction grade III to increase tolerance for PROM    Soft tissue mobilization  STM to R UT    Myofascial Release  manual TPR to R UT- palpable increase in tone and tenderness throughout    Passive ROM  L shoulder PROM to tolerance in all directions with prolonged holds at end range   focus on IR d/t limitation              PT Short Term Goals - 03/21/19 1136      PT SHORT TERM GOAL #1   Title  Patient to be independent with initial HEP.    Time  3    Period  Weeks    Status  Achieved    Target Date  04/05/19        PT Long Term Goals - 03/17/19 1058      PT LONG TERM GOAL #1   Title  Patient to be independent with advanced HEP.    Time  6     Period  Weeks    Status  On-going      PT LONG TERM GOAL #2   Title  Patient to demonstrate L shoulder AROM/PROM WFL and without pain limiting.    Time  6    Period  Weeks    Status  On-going      PT LONG TERM GOAL #3   Title  Patient to demonstrate L shoulder strength >=4+/5.    Time  6    Period  Weeks    Status  On-going      PT LONG TERM GOAL #4   Title  Pt will be able to reach behind her back and to lift carry groceries with less than 1-2/10 pain.     Time  6    Period  Weeks    Status  On-going            Plan - 04/11/19 1156    Clinical Impression Statement  Patient presents to session without sling. Reports that while on a trip to see her family she went to a waterpark where she had to save her niece from falling in the water and subsequently used both arms to pick her up. Reports some soreness in the arm since then. Admits to poor HEP compliance while away- advised patient to pick back up on her exercises. Able to perform L elbow AROM with good ROM- patient now demonstrating observably near-normal ROM in elbow. Introduced prone row and extension to neutral with cues to avoid shoulder hiking. Patient improving form with increasing reps. Patient still limited by pain with PROM, but showing excellent improvement in AAROM with shoulder flexion. Ended session with STM and manual TPR to R UT per patient's request. Ended session without further complaints.    Comorbidities  OA, lumbar DDD, uterine CA, hip surgery, back surgery    PT Treatment/Interventions  ADLs/Self Care Home Management;Cryotherapy;Electrical Stimulation;Moist Heat;Therapeutic exercise;Therapeutic activities;Functional mobility training;Ultrasound;Neuromuscular re-education;Manual techniques;Vasopneumatic Device;Taping;Splinting;Energy conservation;Dry needling;Passive range of motion;Scar mobilization    PT Next Visit Plan  progress L shoulder PROM/AAROM    Consulted and Agree with Plan of Care  Patient  Patient will benefit from skilled therapeutic intervention in order to improve the following deficits and impairments:  Hypomobility, Increased edema, Decreased scar mobility, Decreased knowledge of precautions, Decreased activity tolerance, Decreased strength, Impaired UE functional use, Pain, Decreased range of motion, Improper body mechanics, Postural dysfunction, Impaired flexibility  Visit Diagnosis: 1. Acute pain of left shoulder   2. Stiffness of left shoulder, not elsewhere classified   3. Muscle weakness (generalized)   4. Other symptoms and signs involving the musculoskeletal system        Problem List There are no active problems to display for this patient.    Janene Harvey, PT, DPT 04/11/19 12:05 PM   [facility] [address]  [place], [place], [address] Phone: [phone]   Fax:  [phone]  Name: Shannon [facility]ac[facility] MRN: [id number] Date of Birth: 09/30/1960

## 2019-04-14 ENCOUNTER — Encounter: Payer: Self-pay | Admitting: Physical Therapy

## 2019-04-14 ENCOUNTER — Other Ambulatory Visit: Payer: Self-pay

## 2019-04-14 ENCOUNTER — Ambulatory Visit: Payer: Medicare Other | Admitting: Physical Therapy

## 2019-04-14 DIAGNOSIS — M25512 Pain in left shoulder: Secondary | ICD-10-CM

## 2019-04-14 DIAGNOSIS — M6281 Muscle weakness (generalized): Secondary | ICD-10-CM

## 2019-04-14 DIAGNOSIS — M25612 Stiffness of left shoulder, not elsewhere classified: Secondary | ICD-10-CM

## 2019-04-14 DIAGNOSIS — R29898 Other symptoms and signs involving the musculoskeletal system: Secondary | ICD-10-CM

## 2019-04-14 NOTE — Therapy (Signed)
[facility] [address]  [place], [place], [address] Phone: [phone]   Fax:  [phone]  Physical Therapy Treatment  Patient Details  Name: Shannon [facility] MRN: [id number] Date of Birth: 05-22-1961 Referring Provider (PT): Vonna Drafts, MD   Encounter Date: 04/14/2019  PT End of Session - 04/14/19 1017    Visit Number  8    Number of Visits  13    Date for PT Re-Evaluation  04/26/19    Authorization Type  [employer]HC Medicare & Medicaid    PT Start Time  0931    PT Stop Time  1014    PT Time Calculation (min)  43 min    Activity Tolerance  Patient tolerated treatment well;Patient limited by pain    Behavior During Therapy  [facility] for tasks assessed/performed       Past Medical History:  Diagnosis Date  . Cancer ([place])    UTERINE  . DDD (degenerative disc disease), lumbar   . Osteoarthritis     Past Surgical History:  Procedure Laterality Date  . ABDOMINAL HYSTERECTOMY    . APPENDECTOMY    . BACK SURGERY  2015  . HIP SURGERY      There were no vitals filed for this visit.  Subjective Assessment - 04/14/19 0932    Subjective  Reports that the L shoulder is sore this AM. Still trying to wash her hair herself- may be because of that. Has been having some shooting pains down the anterior arm.    Pertinent History  OA, lumbar DDD, uterine CA, hip surgery, back surgery    Patient Stated Goals  be able to put L arm behind back    Currently in Pain?  Yes    Pain Score  5     Pain Location  Shoulder    Pain Orientation  Left    Pain Descriptors / Indicators  Aching;Sharp    Pain Type  Acute pain;Surgical pain                       [facility] Adult PT Treatment/Exercise - 04/14/19 0001      Shoulder Exercises: Supine   External Rotation  AAROM;Left;10 reps    External Rotation Limitations  with wand to tolerance and towel roll under elbow for neutral positioning    Internal Rotation  AAROM;Left;10 reps     Internal Rotation Limitations  with wand to tolerance and towel roll under elbow for neutral positioning    Flexion  AAROM;Left;10 reps    Flexion Limitations  with wand to tolerance    ABduction  AAROM;Left;10 reps    ABduction Limitations  scaption AAROM with wand to tolerance    Other Supine Exercises  serratus punch with wand 2x10   good control     Shoulder Exercises: Standing   Extension  AROM;Left;10 reps    Extension Limitations  leaning over nustep backrest; stopping at neutral    Row  AROM;Left;10 reps    Row Limitations  leaning over nustep backrest; stopping at neutral   cues to stop at neutral     Manual Therapy   Manual Therapy  Passive ROM;Joint mobilization;Soft tissue mobilization;Myofascial release    Manual therapy comments  supine    Joint Mobilization  L shoulder anterior, posterior, inferior mobs , and gentle distraction grade III to increase tolerance for PROM    Passive ROM  L shoulder PROM to tolerance in all directions with  prolonged holds at end range   limited by pain; intermittent popping in L shoulder            PT Education - 04/14/19 1016    Education Details  update/consolidation of HEP, education on avoiding lifting and overhead movements    Person(s) Educated  Patient    Methods  Explanation;Tactile cues;Demonstration;Verbal cues;Handout    Comprehension  Verbalized understanding;Returned demonstration       PT Short Term Goals - 03/21/19 1136      PT SHORT TERM GOAL #1   Title  Patient to be independent with initial HEP.    Time  3    Period  Weeks    Status  Achieved    Target Date  04/05/19        PT Long Term Goals - 03/17/19 1058      PT LONG TERM GOAL #1   Title  Patient to be independent with advanced HEP.    Time  6    Period  Weeks    Status  On-going      PT LONG TERM GOAL #2   Title  Patient to demonstrate L shoulder AROM/PROM WFL and without pain limiting.    Time  6    Period  Weeks    Status  On-going       PT LONG TERM GOAL #3   Title  Patient to demonstrate L shoulder strength >=4+/5.    Time  6    Period  Weeks    Status  On-going      PT LONG TERM GOAL #4   Title  Pt will be able to reach behind her back and to lift carry groceries with less than 1-2/10 pain.     Time  6    Period  Weeks    Status  On-going            Plan - 04/14/19 1021    Clinical Impression Statement  Patient reporting soreness in L shoulder this AM. Notes that she experienced  shooting pain after getting dressed- with location of pain starting from superior shoulder incision and radiating along clavicle. This pain was not replicated during today's session. Admits to poor compliance with shoulder AAROM exercise and continues to attempt to wash her hair and lift groceries with L arm d/t living alone. Educated patient on avoiding lifting activities and suggested that she ask a friend to help her with these activities. Patient agreeable. Focused session on PROM and AAROM to L shoulder. Patient still limited by pain and guarding at end ranges. IR and ER still most limited, with slight improvement after gentle joint mobilizations. Patient tolerated all AAROM activities within limited range. Updated and consolidated HEP with most up-to-date exercises for improved compliance. Patient reported understanding and with no complaints at end of session.    Comorbidities  OA, lumbar DDD, uterine CA, hip surgery, back surgery    PT Treatment/Interventions  ADLs/Self Care Home Management;Cryotherapy;Electrical Stimulation;Moist Heat;Therapeutic exercise;Therapeutic activities;Functional mobility training;Ultrasound;Neuromuscular re-education;Manual techniques;Vasopneumatic Device;Taping;Splinting;Energy conservation;Dry needling;Passive range of motion;Scar mobilization    PT Next Visit Plan  progress L shoulder PROM/AAROM    Consulted and Agree with Plan of Care  Patient       Patient will benefit from skilled therapeutic  intervention in order to improve the following deficits and impairments:  Hypomobility, Increased edema, Decreased scar mobility, Decreased knowledge of precautions, Decreased activity tolerance, Decreased strength, Impaired UE functional use, Pain, Decreased range of motion, Improper body mechanics,  Postural dysfunction, Impaired flexibility  Visit Diagnosis: 1. Acute pain of left shoulder   2. Stiffness of left shoulder, not elsewhere classified   3. Muscle weakness (generalized)   4. Other symptoms and signs involving the musculoskeletal system        Problem List There are no active problems to display for this patient.    Janene Harvey, PT, DPT 04/14/19 10:26 AM   [facility] [address]  [place], [place], [address] Phone: [phone]   Fax:  [phone]  Name: Christi Wirick MRN: [id number] Date of Birth: [birth year]

## 2019-04-18 ENCOUNTER — Ambulatory Visit: Payer: Medicare Other | Admitting: Physical Therapy

## 2019-04-18 ENCOUNTER — Encounter: Payer: Self-pay | Admitting: Physical Therapy

## 2019-04-18 ENCOUNTER — Other Ambulatory Visit: Payer: Self-pay

## 2019-04-18 DIAGNOSIS — M25612 Stiffness of left shoulder, not elsewhere classified: Secondary | ICD-10-CM

## 2019-04-18 DIAGNOSIS — M25512 Pain in left shoulder: Secondary | ICD-10-CM

## 2019-04-18 DIAGNOSIS — R29898 Other symptoms and signs involving the musculoskeletal system: Secondary | ICD-10-CM

## 2019-04-18 DIAGNOSIS — M6281 Muscle weakness (generalized): Secondary | ICD-10-CM
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
## 2019-04-20 ENCOUNTER — Other Ambulatory Visit: Payer: Self-pay

## 2019-04-20 ENCOUNTER — Ambulatory Visit: Payer: Medicare Other | Admitting: Physical Therapy

## 2019-04-20 ENCOUNTER — Encounter: Payer: Self-pay | Admitting: Physical Therapy

## 2019-04-20 DIAGNOSIS — M25512 Pain in left shoulder: Secondary | ICD-10-CM | POA: Diagnosis not present

## 2019-04-20 DIAGNOSIS — M25612 Stiffness of left shoulder, not elsewhere classified: Secondary | ICD-10-CM

## 2019-04-20 DIAGNOSIS — M6281 Muscle weakness (generalized): Secondary | ICD-10-CM

## 2019-04-20 DIAGNOSIS — R29898 Other symptoms and signs involving the musculoskeletal system: Secondary | ICD-10-CM

## 2019-04-20 NOTE — Therapy (Signed)
[facility] [address]  [place], [place], [address] Phone: [phone]   Fax:  [phone]  Physical Therapy Progress Note  Patient Details  Name: Shannon Delacruz MRN: [id number] Date of Birth: 1961-08-28 Referring Provider (PT): Vonna Drafts, MD   Progress Note Reporting Period 03/15/19 to 04/20/19  See note below for Objective Data and Assessment of Progress/Goals.    Encounter Date: 04/20/2019  PT End of Session - 04/20/19 1207    Visit Number  10    Number of Visits  22    Date for PT Re-Evaluation  06/01/19    Authorization Type  UHC Medicare & Medicaid    PT Start Time  0932    PT Stop Time  1012    PT Time Calculation (min)  40 min    Activity Tolerance  Patient tolerated treatment well;Patient limited by pain    Behavior During Therapy  W[facility] for tasks assessed/performed       Past Medical History:  Diagnosis Date  . Cancer ([place])    UTERINE  . DDD (degenerative disc disease), lumbar   . Osteoarthritis     Past Surgical History:  Procedure Laterality Date  . ABDOMINAL HYSTERECTOMY    . APPENDECTOMY    . BACK SURGERY  2015  . HIP SURGERY      There were no vitals filed for this visit.  Subjective Assessment - 04/20/19 0934    Subjective  Present with her friend Juliann Pulse to learn how to stretch her shoulder for her. Feeling a bit stiff this AM. Reports 80% improvement in her shoulder. Would like to continue working on reaching overhead and behind the back.    Pertinent History  OA, lumbar DDD, uterine CA, hip surgery, back surgery    Patient Stated Goals  be able to put L arm behind back    Currently in Pain?  Yes    Pain Score  4     Pain Location  Shoulder    Pain Orientation  Left    Pain Descriptors / Indicators  Aching;Sharp    Pain Type  Acute pain;Surgical pain         [facility] PT Assessment - 04/20/19 0001      Assessment   Medical Diagnosis  L Unspecified RTC tear or rupture of L  shoulder, not specified as traumatic    Referring Provider (PT)  Vonna Drafts, MD    Onset Date/Surgical Date  03/03/19      Observation/Other Assessments   Focus on Therapeutic Outcomes (FOTO)   Shoulder: 47 (53% limited, 37% predicted)      AROM   Right/Left Shoulder  Left    Right Shoulder Flexion  --    Right Shoulder ABduction  --    Left Shoulder Flexion  120 Degrees   AAROM with wand   Left Shoulder ABduction  72 Degrees   AAROM with wand   Left Shoulder Internal Rotation  44 Degrees   AAROM with wand   Left Shoulder External Rotation  25 Degrees   AAROM with wand     PROM   Left Shoulder Flexion  134 Degrees    Left Shoulder ABduction  80 Degrees    Left Shoulder Internal Rotation  51 Degrees    Left Shoulder External Rotation  25 Degrees      Strength   Left Shoulder Flexion  --   NT d/t pain   Left Shoulder ABduction  --  NT d/t pain   Left Shoulder Internal Rotation  --   NT d/t pain   Left Shoulder External Rotation  --   NT d/t pain                  [facility] Adult PT Treatment/Exercise - 04/20/19 0001      Self-Care   Self-Care  Other Self-Care Comments    Other Self-Care Comments   edu, demonstration, and practice to assist friend Juliann Pulse) to provide L shoulder PROM safely and without pain      Shoulder Exercises: Supine   External Rotation  AAROM;Left;5 reps    External Rotation Limitations  with wand to tolerance and towel roll under elbow for neutral positioning    Internal Rotation  AAROM;Left;5 reps    Internal Rotation Limitations  with wand to tolerance and towel roll under elbow for neutral positioning    Flexion  AAROM;Left;5 reps    Flexion Limitations  with wand to tolerance    ABduction  AAROM;Left;5 reps    ABduction Limitations  scaption AAROM with wand to tolerance      Shoulder Exercises: Isometric Strengthening   Flexion  5X10"    Flexion Limitations  30% effort with towel roll under elbow    External Rotation  5X10"     External Rotation Limitations  30% effort with towel roll under elbow    Internal Rotation  5X10"    Internal Rotation Limitations  30% effort with towel roll under elbow      Manual Therapy   Manual Therapy  Passive ROM;Joint mobilization;Soft tissue mobilization;Myofascial release    Manual therapy comments  supine    Passive ROM  L shoulder PROM to tolerance in all directions with prolonged holds at end range and gentle distraction to improve tolerance             PT Education - 04/20/19 1206    Education Details  discussion on objective improvements with PT thus far    Person(s) Educated  Patient    Methods  Explanation    Comprehension  Verbalized understanding       PT Short Term Goals - 04/20/19 0941      PT SHORT TERM GOAL #1   Title  Patient to be independent with initial HEP.    Time  3    Period  Weeks    Status  Achieved    Target Date  04/05/19        PT Long Term Goals - 04/20/19 0941      PT LONG TERM GOAL #1   Title  Patient to be independent with advanced HEP.    Time  6    Period  Weeks    Status  Partially Met   met for current   Target Date  06/01/19      PT LONG TERM GOAL #2   Title  Patient to demonstrate L shoulder AROM/PROM [facility] and without pain limiting.    Time  6    Period  Weeks    Status  Partially Met   Patient has demonstrated improvements in L shoulder PROM in all planes. Now able to tolerate AAROM in all planes with use of wand.   Target Date  06/01/19      PT LONG TERM GOAL #3   Title  Patient to demonstrate L shoulder strength >=4+/5.    Time  6    Period  Weeks    Status  On-going  NT d/t pain   Target Date  06/01/19      PT LONG TERM GOAL #4   Title  Pt will be able to reach behind her back and to lift carry groceries with less than 1-2/10 pain.     Time  6    Period  Weeks    Status  On-going   NT d/t surgical precautions   Target Date  06/01/19            Plan - 04/20/19 1212    Clinical Impression  Statement  Patient present with friend, Juliann Pulse, requesting for PT to instruct patient's friend on L shoulder PROM. Patient reported 80% improvement in L shoulder since initial eval. Would like to continue working on reaching overhead and behind the back. Strength testing not addressed today d/t pain. Patient has demonstrated improvements in L shoulder PROM in all planes. Now able to tolerate AAROM in all planes with use of wand. Lifting goal not tested d/t surgical precautions. Instructed patient and her friend on safe L shoulder PROM to patient's tolerance. Both reported understanding. Initiated gentle RTC isometrics with patient tolerating this well. Patient without complaints at end of session. Patient is demonstrating slow but steady progress in ROM. Would benefit from additional skilled PT services 2x/week for 6 weeks to address strength impairments and remaining goals.    Comorbidities  OA, lumbar DDD, uterine CA, hip surgery, back surgery    PT Frequency  2x / week    PT Duration  6 weeks    PT Treatment/Interventions  ADLs/Self Care Home Management;Cryotherapy;Electrical Stimulation;Moist Heat;Therapeutic exercise;Therapeutic activities;Functional mobility training;Ultrasound;Neuromuscular re-education;Manual techniques;Vasopneumatic Device;Taping;Splinting;Energy conservation;Dry needling;Passive range of motion;Scar mobilization    PT Next Visit Plan  progress L shoulder PROM/AAROM    Consulted and Agree with Plan of Care  Patient       Patient will benefit from skilled therapeutic intervention in order to improve the following deficits and impairments:  Hypomobility, Increased edema, Decreased scar mobility, Decreased knowledge of precautions, Decreased activity tolerance, Decreased strength, Impaired UE functional use, Pain, Decreased range of motion, Improper body mechanics, Postural dysfunction, Impaired flexibility  Visit Diagnosis: 1. Acute pain of left shoulder   2. Stiffness of left  shoulder, not elsewhere classified   3. Muscle weakness (generalized)   4. Other symptoms and signs involving the musculoskeletal system        Problem List There are no active problems to display for this patient.   Janene Harvey, PT, DPT 04/20/19 12:17 PM   [facility] [address]  [place], [place], [address] Phone: [phone]   Fax:  [phone]  Name: Deserai Cansler MRN: [id number] Date of Birth: [birth year]

## 2019-04-26 ENCOUNTER — Other Ambulatory Visit: Payer: Self-pay

## 2019-04-26 ENCOUNTER — Encounter: Payer: Self-pay | Admitting: Physical Therapy

## 2019-04-26 ENCOUNTER — Ambulatory Visit: Payer: Medicare Other | Admitting: Physical Therapy

## 2019-04-26 DIAGNOSIS — M6281 Muscle weakness (generalized): Secondary | ICD-10-CM

## 2019-04-26 DIAGNOSIS — M25612 Stiffness of left shoulder, not elsewhere classified: Secondary | ICD-10-CM

## 2019-04-26 DIAGNOSIS — R29898 Other symptoms and signs involving the musculoskeletal system: Secondary | ICD-10-CM

## 2019-04-26 DIAGNOSIS — M25512 Pain in left shoulder: Secondary | ICD-10-CM | POA: Diagnosis not present

## 2019-04-26 NOTE — Therapy (Signed)
[facility] [address]  [place], [place], [address] Phone: [phone]   Fax:  [phone]  Physical Therapy Treatment  Patient Details  Name: Shannon Delacruz MRN: [id number] Date of Birth: 1960/10/30 Referring Provider (PT): Vonna Drafts, MD   Encounter Date: 04/26/2019  PT End of Session - 04/26/19 1013    Visit Number  11    Number of Visits  22    Date for PT Re-Evaluation  06/01/19    Authorization Type  UHC Medicare & Medicaid    PT Start Time  0932    PT Stop Time  1011    PT Time Calculation (min)  39 min    Activity Tolerance  Patient tolerated treatment well;Patient limited by pain    Behavior During Therapy  [facility] for tasks assessed/performed       Past Medical History:  Diagnosis Date  . Cancer ([place])    UTERINE  . DDD (degenerative disc disease), lumbar   . Osteoarthritis     Past Surgical History:  Procedure Laterality Date  . ABDOMINAL HYSTERECTOMY    . APPENDECTOMY    . BACK SURGERY  2015  . HIP SURGERY      There were no vitals filed for this visit.  Subjective Assessment - 04/26/19 0932    Subjective  Reports that her surgeon would like her to continue with PT but is happy with her progress.    Pertinent History  OA, lumbar DDD, uterine CA, hip surgery, back surgery    Patient Stated Goals  be able to put L arm behind back    Currently in Pain?  Yes    Pain Score  5     Pain Location  Shoulder    Pain Orientation  Left    Pain Descriptors / Indicators  Aching    Pain Type  Surgical pain;Acute pain                       [facility] Adult PT Treatment/Exercise - 04/26/19 0001      Shoulder Exercises: Supine   Protraction  AAROM;Left;15 reps    Protraction Limitations  serratus punch with 2# for 5 reps, 1# for 10 reps   minimal ROM on L UE   Flexion  AAROM;Left;10 reps    Flexion Limitations  with wand to tolerance    ABduction  AAROM;Left;10 reps    ABduction  Limitations  scaption AAROM with wand to tolerance    Other Supine Exercises  L flexion AROM concentric phase, PROM eccentric phase 2x5      Shoulder Exercises: Sidelying   External Rotation  AROM;10 reps;Strengthening;Left    External Rotation Weight (lbs)  1    External Rotation Limitations  2x10; dowel under elbow; stopping at neutral    ABduction  AROM;Left;5 reps    ABduction Limitations  2x5; concentric AROM, eccentric PROM       Shoulder Exercises: Standing   External Rotation  Strengthening;Left;10 reps;Theraband    Theraband Level (Shoulder External Rotation)  Level 1 (Yellow)    External Rotation Limitations  dowel under elbow; cues to maintain neutral    Internal Rotation  Strengthening;Left;10 reps;Theraband    Theraband Level (Shoulder Internal Rotation)  Level 1 (Yellow)    Internal Rotation Limitations  dowel under elbow; cues to maintain neutral             PT Education - 04/26/19 1013    Education Details  update to HEP    Person(s) Educated  Patient    Methods  Explanation;Demonstration;Tactile cues;Verbal cues;Handout    Comprehension  Verbalized understanding;Returned demonstration       PT Short Term Goals - 04/20/19 0941      PT SHORT TERM GOAL #1   Title  Patient to be independent with initial HEP.    Time  3    Period  Weeks    Status  Achieved    Target Date  04/05/19        PT Long Term Goals - 04/20/19 0941      PT LONG TERM GOAL #1   Title  Patient to be independent with advanced HEP.    Time  6    Period  Weeks    Status  Partially Met   met for current   Target Date  06/01/19      PT LONG TERM GOAL #2   Title  Patient to demonstrate L shoulder AROM/PROM [facility] and without pain limiting.    Time  6    Period  Weeks    Status  Partially Met   Patient has demonstrated improvements in L shoulder PROM in all planes. Now able to tolerate AAROM in all planes with use of wand.   Target Date  06/01/19      PT LONG TERM GOAL #3   Title   Patient to demonstrate L shoulder strength >=4+/5.    Time  6    Period  Weeks    Status  On-going   NT d/t pain   Target Date  06/01/19      PT LONG TERM GOAL #4   Title  Pt will be able to reach behind her back and to lift carry groceries with less than 1-2/10 pain.     Time  6    Period  Weeks    Status  On-going   NT d/t surgical precautions   Target Date  06/01/19            Plan - 04/26/19 1014    Clinical Impression Statement  Patient arrived to session with good report from MD. Worked on L shoulder AAROM with patient demonstrating good carryover of movement pattern. Cues required to stretch to tolerable limits at end range. Initiated supine AROM flexion with PROM on eccentric phase- patient demonstrating limited ROM but able to tolerate this despite mild pain. Increased weighted resistance with sidelying ER with good form. Introduced IR/ER isometric step outs with patient demonstrating good form with IR, more difficulty with ER. Patient reported understanding of HEP update. No complaints at end of session.    Comorbidities  OA, lumbar DDD, uterine CA, hip surgery, back surgery    PT Frequency  2x / week    PT Duration  6 weeks    PT Treatment/Interventions  ADLs/Self Care Home Management;Cryotherapy;Electrical Stimulation;Moist Heat;Therapeutic exercise;Therapeutic activities;Functional mobility training;Ultrasound;Neuromuscular re-education;Manual techniques;Vasopneumatic Device;Taping;Splinting;Energy conservation;Dry needling;Passive range of motion;Scar mobilization    PT Next Visit Plan  improve L shoulder ROM and strength    Consulted and Agree with Plan of Care  Patient       Patient will benefit from skilled therapeutic intervention in order to improve the following deficits and impairments:  Hypomobility, Increased edema, Decreased scar mobility, Decreased knowledge of precautions, Decreased activity tolerance, Decreased strength, Impaired UE functional use, Pain,  Decreased range of motion, Improper body mechanics, Postural dysfunction, Impaired flexibility  Visit Diagnosis: 1. Acute pain of left shoulder  2. Stiffness of left shoulder, not elsewhere classified   3. Muscle weakness (generalized)   4. Other symptoms and signs involving the musculoskeletal system        Problem List There are no active problems to display for this patient.    Janene Harvey, PT, DPT 04/26/19 10:16 AM   [facility] [address]  [place], [place], [address] Phone: [phone]   Fax:  [phone]  Name: Ayanah Snader MRN: [id number] Date of Birth: [birth year]

## 2019-04-28 ENCOUNTER — Encounter: Payer: Self-pay | Admitting: Physical Therapy

## 2019-04-28 ENCOUNTER — Ambulatory Visit: Payer: Medicare Other | Admitting: Physical Therapy

## 2019-04-28 ENCOUNTER — Other Ambulatory Visit: Payer: Self-pay

## 2019-04-28 DIAGNOSIS — M25512 Pain in left shoulder: Secondary | ICD-10-CM | POA: Diagnosis not present

## 2019-04-28 DIAGNOSIS — M25612 Stiffness of left shoulder, not elsewhere classified: Secondary | ICD-10-CM

## 2019-04-28 DIAGNOSIS — R29898 Other symptoms and signs involving the musculoskeletal system: Secondary | ICD-10-CM

## 2019-04-28 DIAGNOSIS — M6281 Muscle weakness (generalized): Secondary | ICD-10-CM

## 2019-04-28 NOTE — Therapy (Signed)
[facility] [address]  [place], [place], [address] Phone: [phone]   Fax:  [phone]  Physical Therapy Treatment  Patient Details  Name: Shannon Delacruz MRN: [id number] Date of Birth: December 15, 1960 Referring Provider (PT): Vonna Drafts, MD   Encounter Date: 04/28/2019  PT End of Session - 04/28/19 1657    Visit Number  12    Number of Visits  22    Date for PT Re-Evaluation  06/01/19    Authorization Type  [employer] Medicare & Medicaid    PT Start Time  3419    PT Stop Time  1656    PT Time Calculation (min)  39 min    Activity Tolerance  Patient tolerated treatment well;Patient limited by pain    Behavior During Therapy  [facility] for tasks assessed/performed       Past Medical History:  Diagnosis Date  . Cancer ([place])    UTERINE  . DDD (degenerative disc disease), lumbar   . Osteoarthritis     Past Surgical History:  Procedure Laterality Date  . ABDOMINAL HYSTERECTOMY    . APPENDECTOMY    . BACK SURGERY  2015  . HIP SURGERY      There were no vitals filed for this visit.  Subjective Assessment - 04/28/19 1618    Subjective  Reports that she had a little soreness after last session but it quickly resolved. Washing her hair is getting easier.    Pertinent History  OA, lumbar DDD, uterine CA, hip surgery, back surgery    Patient Stated Goals  be able to put L arm behind back    Currently in Pain?  No/denies                       [facility] Adult PT Treatment/Exercise - 04/28/19 0001      Shoulder Exercises: Supine   Protraction  Strengthening;10 reps;Weights    Protraction Weight (lbs)  2    Protraction Limitations  improving tolerance    Flexion  AROM;Left;10 reps    Flexion Limitations  5x AROM concentric phase, PROM eccentric phase; 5x with 1/2 of the eccentric phase on her own      Shoulder Exercises: Seated   External Rotation  AAROM;Left;10 reps    External Rotation Limitations  with  wand to tolerance; elbows by sides    Internal Rotation  AAROM;Left;10 reps    Internal Rotation Limitations  with wand to tolerance; elbows by sides    Flexion  AAROM;Left;10 reps    Flexion Limitations  with wand to tolerance   cues to avoid shoulder hiking   Abduction  AAROM;Left;10 reps    ABduction Limitations  with wand to tolerance      Shoulder Exercises: Sidelying   External Rotation  Strengthening;Left;15 reps;Weights    External Rotation Weight (lbs)  1    External Rotation Limitations  dowel under elbow; stopping at neutral    ABduction  AROM;Left;10 reps    ABduction Limitations  thumb up      Shoulder Exercises: Pulleys   Flexion  3 minutes    Flexion Limitations  to tolerance; cues to avoid pushing into pain    Scaption  3 minutes    Scaption Limitations  to tolerance; cues to avoid pushing into pain      Manual Therapy   Manual Therapy  Joint mobilization    Passive ROM  L shoulder PROM to tolerance in all directions  with prolonged holds at end range   good ROM into flexion, most limited and painful in IR/ER            PT Education - 04/28/19 1657    Education Details  update to HEP    Person(s) Educated  Patient    Methods  Explanation;Demonstration;Tactile cues;Verbal cues;Handout    Comprehension  Verbalized understanding;Returned demonstration       PT Short Term Goals - 04/20/19 0941      PT SHORT TERM GOAL #1   Title  Patient to be independent with initial HEP.    Time  3    Period  Weeks    Status  Achieved    Target Date  04/05/19        PT Long Term Goals - 04/20/19 0941      PT LONG TERM GOAL #1   Title  Patient to be independent with advanced HEP.    Time  6    Period  Weeks    Status  Partially Met   met for current   Target Date  06/01/19      PT LONG TERM GOAL #2   Title  Patient to demonstrate L shoulder AROM/PROM [facility] and without pain limiting.    Time  6    Period  Weeks    Status  Partially Met   Patient has  demonstrated improvements in L shoulder PROM in all planes. Now able to tolerate AAROM in all planes with use of wand.   Target Date  06/01/19      PT LONG TERM GOAL #3   Title  Patient to demonstrate L shoulder strength >=4+/5.    Time  6    Period  Weeks    Status  On-going   NT d/t pain   Target Date  06/01/19      PT LONG TERM GOAL #4   Title  Pt will be able to reach behind her back and to lift carry groceries with less than 1-2/10 pain.     Time  6    Period  Weeks    Status  On-going   NT d/t surgical precautions   Target Date  06/01/19            Plan - 04/28/19 1657    Clinical Impression Statement  Patient arrived to session with no new complaints. Tolerated addition of pulley AAROM with mild soreness at the start, but improvement in tolerance with increased reps. Tolerated L shoulder PROM in all directions with good ROM into flexion, still most limitation evident in IR/ER. Able to perform  of the eccentric phase herself with supine flexion and concentric and eccentric phases with sidelying abduction without assistance. Able to perform L shoulder AAROM in sitting rather than supine today, with tendency to hike shoulder with flexion. Updated HEP with exercises that were well-tolerated today. Patient reported understanding and with no complaints at end of session.    Comorbidities  OA, lumbar DDD, uterine CA, hip surgery, back surgery    PT Frequency  2x / week    PT Duration  6 weeks    PT Treatment/Interventions  ADLs/Self Care Home Management;Cryotherapy;Electrical Stimulation;Moist Heat;Therapeutic exercise;Therapeutic activities;Functional mobility training;Ultrasound;Neuromuscular re-education;Manual techniques;Vasopneumatic Device;Taping;Splinting;Energy conservation;Dry needling;Passive range of motion;Scar mobilization    PT Next Visit Plan  improve L shoulder ROM and strength    Consulted and Agree with Plan of Care  Patient       Patient will benefit from  skilled  therapeutic intervention in order to improve the following deficits and impairments:  Hypomobility, Increased edema, Decreased scar mobility, Decreased knowledge of precautions, Decreased activity tolerance, Decreased strength, Impaired UE functional use, Pain, Decreased range of motion, Improper body mechanics, Postural dysfunction, Impaired flexibility  Visit Diagnosis: Acute pain of left shoulder  Stiffness of left shoulder, not elsewhere classified  Muscle weakness (generalized)  Other symptoms and signs involving the musculoskeletal system     Problem List There are no active problems to display for this patient.    Janene Harvey, PT, DPT 04/28/19 5:01 PM   [facility] [address]  [place], [place], [address] Phone: [phone]   Fax:  [phone]  Name: Shannon Delacruz MRN: [id number] Date of Birth: February 21, 1961

## 2019-05-03 ENCOUNTER — Other Ambulatory Visit: Payer: Self-pay

## 2019-05-03 ENCOUNTER — Ambulatory Visit: Payer: Medicare Other | Admitting: Physical Therapy

## 2019-05-03 ENCOUNTER — Encounter: Payer: Self-pay | Admitting: Physical Therapy

## 2019-05-03 DIAGNOSIS — M6281 Muscle weakness (generalized): Secondary | ICD-10-CM

## 2019-05-03 DIAGNOSIS — M25612 Stiffness of left shoulder, not elsewhere classified: Secondary | ICD-10-CM

## 2019-05-03 DIAGNOSIS — M25512 Pain in left shoulder: Secondary | ICD-10-CM | POA: Diagnosis not present

## 2019-05-03 DIAGNOSIS — R29898 Other symptoms and signs involving the musculoskeletal system: Secondary | ICD-10-CM

## 2019-05-03 NOTE — Therapy (Signed)
[facility] [address]  [place], [place], [address] Phone: [phone]   Fax:  [phone]  Physical Therapy Treatment  Patient Details  Name: Shannon Delacruz MRN: [id number] Date of Birth: [birth year] Referring Provider (PT): Vonna Drafts, MD   Encounter Date: 05/03/2019  PT End of Session - 05/03/19 1539    Visit Number  13    Number of Visits  22    Date for PT Re-Evaluation  06/01/19    Authorization Type  [employer]HC Medicare & Medicaid    PT Start Time  7616    PT Stop Time  1534    PT Time Calculation (min)  49 min    Activity Tolerance  Patient tolerated treatment well;Patient limited by pain    Behavior During Therapy  [facility] for tasks assessed/performed       Past Medical History:  Diagnosis Date  . Cancer ([place])    UTERINE  . DDD (degenerative disc disease), lumbar   . Osteoarthritis     Past Surgical History:  Procedure Laterality Date  . ABDOMINAL HYSTERECTOMY    . APPENDECTOMY    . BACK SURGERY  2015  . HIP SURGERY      There were no vitals filed for this visit.  Subjective Assessment - 05/03/19 1445    Subjective  Reports that she is doing well.    Pertinent History  OA, lumbar DDD, uterine CA, hip surgery, back surgery    Patient Stated Goals  be able to put L arm behind back    Currently in Pain?  Yes    Pain Score  6    during pulleys   Pain Location  Shoulder    Pain Orientation  Left    Pain Descriptors / Indicators  Aching    Pain Type  Acute pain;Surgical pain                       [facility] Adult PT Treatment/Exercise - 05/03/19 0001      Shoulder Exercises: Supine   Protraction  Strengthening;10 reps;Weights    Protraction Weight (lbs)  3    Protraction Limitations  2x10; reporting mild soreness in L shoulder    Flexion  AROM;Left;10 reps    Flexion Limitations  to tolerance      Shoulder Exercises: Seated   External Rotation  AAROM;Left;10 reps    External  Rotation Limitations  with wand to tolerance; elbows by sides    Internal Rotation  AAROM;Left;10 reps    Internal Rotation Limitations  with wand to tolerance; elbows by sides    Flexion  AAROM;Left;10 reps    Flexion Limitations  with wand to tolerance   cues to avoid hiking L shoulder   Abduction  AAROM;Left;10 reps    ABduction Limitations  with wand to tolerance    Other Seated Exercises  L shoulder flexion AROM with assistance during eccentric phase within very limited range x10   mirror feedback to monitor L shoulder hiking     Shoulder Exercises: Standing   External Rotation  Strengthening;Left;10 reps;Theraband    Theraband Level (Shoulder External Rotation)  Level 1 (Yellow)    External Rotation Limitations  dowel under elbow; cues to maintain neutral    Internal Rotation  Strengthening;Left;10 reps;Theraband    Theraband Level (Shoulder Internal Rotation)  Level 1 (Yellow)    Internal Rotation Limitations  dowel under elbow; cues to maintain neutral  Shoulder Exercises: Pulleys   Flexion  3 minutes    Flexion Limitations  to tolerance    Scaption  3 minutes    Scaption Limitations  to tolerance      Manual Therapy   Manual Therapy  Passive ROM;Soft tissue mobilization    Soft tissue mobilization  STM to L UT and LS- large tendern trigger pt over medial UT    Myofascial Release  manual TPR to L UT    Passive ROM  L shoulder PROM to tolerance in all directions with prolonged holds at end range and gentle distraction to improve comfort             PT Education - 05/03/19 1538    Education Details  update to HEP; advised to increase to using cans of soup for serratus punches and perform standing AAROM in front of mirror to monitor shoulder hiking    Person(s) Educated  Patient    Methods  Explanation;Demonstration;Tactile cues;Verbal cues    Comprehension  Verbalized understanding;Returned demonstration       PT Short Term Goals - 04/20/19 0941      PT  SHORT TERM GOAL #1   Title  Patient to be independent with initial HEP.    Time  3    Period  Weeks    Status  Achieved    Target Date  04/05/19        PT Long Term Goals - 04/20/19 0941      PT LONG TERM GOAL #1   Title  Patient to be independent with advanced HEP.    Time  6    Period  Weeks    Status  Partially Met   met for current   Target Date  06/01/19      PT LONG TERM GOAL #2   Title  Patient to demonstrate L shoulder AROM/PROM [facility] and without pain limiting.    Time  6    Period  Weeks    Status  Partially Met   Patient has demonstrated improvements in L shoulder PROM in all planes. Now able to tolerate AAROM in all planes with use of wand.   Target Date  06/01/19      PT LONG TERM GOAL #3   Title  Patient to demonstrate L shoulder strength >=4+/5.    Time  6    Period  Weeks    Status  On-going   NT d/t pain   Target Date  06/01/19      PT LONG TERM GOAL #4   Title  Pt will be able to reach behind her back and to lift carry groceries with less than 1-2/10 pain.     Time  6    Period  Weeks    Status  On-going   NT d/t surgical precautions   Target Date  06/01/19            Plan - 05/03/19 1539    Clinical Impression Statement  Patient reports that she is doing well. Tolerated L shoulder PROM with gentle distraction to improve comfort. Patient's PROM still limited in all directions, but slowly improving. Able to tolerate an increase in weighted resistance with serratus punches. Able to tolerate supine L shoulder flexion AROM during concentric and eccentric phases, instead of requiring assistance on the way down. Attempted sitting shoulder flexion AROM with mirror feedback and very limited ROM. Patient with considerable difficulty with this activity, especially when given cues to avoid shoulder hiking. Able  to perform good form with banded shoulder IR/ER after giving cues. Updated HEP with these activities- patient reported understanding. Ended session with  STM and manual TPR to L UT d/t pain and palpable trigger point. Patient reported mild relief at end of session.    Comorbidities  OA, lumbar DDD, uterine CA, hip surgery, back surgery    PT Frequency  2x / week    PT Duration  6 weeks    PT Treatment/Interventions  ADLs/Self Care Home Management;Cryotherapy;Electrical Stimulation;Moist Heat;Therapeutic exercise;Therapeutic activities;Functional mobility training;Ultrasound;Neuromuscular re-education;Manual techniques;Vasopneumatic Device;Taping;Splinting;Energy conservation;Dry needling;Passive range of motion;Scar mobilization    PT Next Visit Plan  improve L shoulder ROM and strength    Consulted and Agree with Plan of Care  Patient       Patient will benefit from skilled therapeutic intervention in order to improve the following deficits and impairments:  Hypomobility, Increased edema, Decreased scar mobility, Decreased knowledge of precautions, Decreased activity tolerance, Decreased strength, Impaired UE functional use, Pain, Decreased range of motion, Improper body mechanics, Postural dysfunction, Impaired flexibility  Visit Diagnosis: Acute pain of left shoulder  Stiffness of left shoulder, not elsewhere classified  Muscle weakness (generalized)  Other symptoms and signs involving the musculoskeletal system     Problem List There are no active problems to display for this patient.    Janene Harvey, PT, DPT 05/03/19 3:44 PM   [facility] [address]  [place], [place], [address] Phone: [phone]   Fax:  [phone]  Name: Morene Cecilio MRN: [id number] Date of Birth: 1961-05-23

## 2019-05-05 ENCOUNTER — Encounter: Payer: Self-pay | Admitting: Physical Therapy

## 2019-05-05 ENCOUNTER — Other Ambulatory Visit: Payer: Self-pay

## 2019-05-05 ENCOUNTER — Ambulatory Visit: Payer: Medicare Other | Admitting: Physical Therapy

## 2019-05-05 DIAGNOSIS — M25612 Stiffness of left shoulder, not elsewhere classified: Secondary | ICD-10-CM

## 2019-05-05 DIAGNOSIS — R29898 Other symptoms and signs involving the musculoskeletal system: Secondary | ICD-10-CM

## 2019-05-05 DIAGNOSIS — M6281 Muscle weakness (generalized): Secondary | ICD-10-CM

## 2019-05-05 DIAGNOSIS — M25512 Pain in left shoulder: Secondary | ICD-10-CM | POA: Diagnosis not present

## 2019-05-05 NOTE — Therapy (Signed)
[facility] [address]  [place], [place], [address] Phone: [phone]   Fax:  [phone]  Physical Therapy Treatment  Patient Details  Name: Shannon Delacruz MRN: [id number] Date of Birth: September 01, 1961 Referring Provider (PT): Vonna Drafts, MD   Encounter Date: 05/05/2019  PT End of Session - 05/05/19 1838    Visit Number  14    Number of Visits  22    Date for PT Re-Evaluation  06/01/19    Authorization Type  UHC Medicare & Medicaid    PT Start Time  1314    PT Stop Time  1358    PT Time Calculation (min)  44 min    Activity Tolerance  Patient tolerated treatment well;Patient limited by pain    Behavior During Therapy  [facility] for tasks assessed/performed       Past Medical History:  Diagnosis Date  . Cancer ([place])    UTERINE  . DDD (degenerative disc disease), lumbar   . Osteoarthritis     Past Surgical History:  Procedure Laterality Date  . ABDOMINAL HYSTERECTOMY    . APPENDECTOMY    . BACK SURGERY  2015  . HIP SURGERY      There were no vitals filed for this visit.  Subjective Assessment - 05/05/19 1315    Subjective  Reports that she is doing well. No soreness after last session.    Pertinent History  OA, lumbar DDD, uterine CA, hip surgery, back surgery    Patient Stated Goals  be able to put L arm behind back    Currently in Pain?  Yes    Pain Score  3     Pain Location  Shoulder    Pain Orientation  Left    Pain Descriptors / Indicators  Aching    Pain Type  Acute pain;Surgical pain                       [facility] Adult PT Treatment/Exercise - 05/05/19 0001      Shoulder Exercises: Supine   Protraction  Strengthening;10 reps;Weights    Protraction Weight (lbs)  3    Protraction Limitations  2x10; good tolerance    Flexion  AROM;Left;10 reps    Flexion Limitations  1st 5 reps with 1# and assistance to return down; last 5 reps without weight d/t difficulty      Shoulder  Exercises: Seated   Flexion  AAROM;Left;5 reps    Flexion Limitations  with wand to tolerance   L shoulder hiking   Other Seated Exercises  L shoulder flexion rollouts with orange pball 10x5" to tolerance      Shoulder Exercises: Sidelying   External Rotation  Strengthening;Left;Weights;10 reps    External Rotation Weight (lbs)  1,2    External Rotation Limitations  dowel under elbow; stopping at neutral   good form   ABduction  AROM;Left;10 reps    ABduction Limitations  thumb up; first 5 reps with 0#, last 5 reps with 1#      Shoulder Exercises: Pulleys   Flexion  3 minutes    Flexion Limitations  to tolerance    Scaption  3 minutes    Scaption Limitations  to tolerance   cues to correction plane of motion     Manual Therapy   Manual Therapy  Passive ROM;Soft tissue mobilization    Manual therapy comments  supine    Joint Mobilization  L shoulder anterior, posterior,  inferior mobs , and gentle distraction grade III to increase tolerance for PROM    Passive ROM  L shoulder PROM to tolerance in all directions with prolonged holds at end range and gentle distraction to improve comfort             PT Education - 05/05/19 1838    Education Details  update to [employer]) Educated  Patient    Methods  Explanation;Demonstration;Tactile cues;Verbal cues;Handout    Comprehension  Verbalized understanding;Returned demonstration       PT Short Term Goals - 04/20/19 0941      PT SHORT TERM GOAL #1   Title  Patient to be independent with initial HEP.    Time  3    Period  Weeks    Status  Achieved    Target Date  04/05/19        PT Long Term Goals - 04/20/19 0941      PT LONG TERM GOAL #1   Title  Patient to be independent with advanced HEP.    Time  6    Period  Weeks    Status  Partially Met   met for current   Target Date  06/01/19      PT LONG TERM GOAL #2   Title  Patient to demonstrate L shoulder AROM/PROM [facility] and without pain limiting.    Time  6     Period  Weeks    Status  Partially Met   Patient has demonstrated improvements in L shoulder PROM in all planes. Now able to tolerate AAROM in all planes with use of wand.   Target Date  06/01/19      PT LONG TERM GOAL #3   Title  Patient to demonstrate L shoulder strength >=4+/5.    Time  6    Period  Weeks    Status  On-going   NT d/t pain   Target Date  06/01/19      PT LONG TERM GOAL #4   Title  Pt will be able to reach behind her back and to lift carry groceries with less than 1-2/10 pain.     Time  6    Period  Weeks    Status  On-going   NT d/t surgical precautions   Target Date  06/01/19            Plan - 05/05/19 1839    Clinical Impression Statement  Patient without complaints at beginning of session. Tolerated PROM to L shoulder with gentle joint mobilizations to help with ROM and comfort. Patient continues to demonstrate limitation and guarding at end ranges. Attempted to performed L shoulder flexion in supine with light weight, however patient reporting difficulty, thus it was continued without weight. Patient performing sidelying ER with good form, thus tolerated addition of weight without problems. Still with visible L shoulder hiking with sitting AAROM, thus introduced sitting AAROM rollouts with pball, and added these to HEP. Patient reported understanding. No complaints at end of session.    Comorbidities  OA, lumbar DDD, uterine CA, hip surgery, back surgery    PT Frequency  2x / week    PT Duration  6 weeks    PT Treatment/Interventions  ADLs/Self Care Home Management;Cryotherapy;Electrical Stimulation;Moist Heat;Therapeutic exercise;Therapeutic activities;Functional mobility training;Ultrasound;Neuromuscular re-education;Manual techniques;Vasopneumatic Device;Taping;Splinting;Energy conservation;Dry needling;Passive range of motion;Scar mobilization    PT Next Visit Plan  improve L shoulder ROM and strength    Consulted and Agree with Plan of  Care  Patient        Patient will benefit from skilled therapeutic intervention in order to improve the following deficits and impairments:  Hypomobility, Increased edema, Decreased scar mobility, Decreased knowledge of precautions, Decreased activity tolerance, Decreased strength, Impaired UE functional use, Pain, Decreased range of motion, Improper body mechanics, Postural dysfunction, Impaired flexibility  Visit Diagnosis: Acute pain of left shoulder  Stiffness of left shoulder, not elsewhere classified  Muscle weakness (generalized)  Other symptoms and signs involving the musculoskeletal system     Problem List There are no active problems to display for this patient.    Janene Harvey, PT, DPT 05/05/19 6:41 PM   [facility] [address]  [place], [place], [address] Phone: [phone]   Fax:  [phone]  Name: Nimah Uphoff MRN: [id number] Date of Birth: 04/03/1961

## 2019-05-09 ENCOUNTER — Other Ambulatory Visit: Payer: Self-pay

## 2019-05-09 ENCOUNTER — Ambulatory Visit: Payer: Medicare Other | Admitting: Physical Therapy

## 2019-05-09 ENCOUNTER — Encounter: Payer: Self-pay | Admitting: Physical Therapy

## 2019-05-09 VITALS — BP 145/90 | HR 74

## 2019-05-09 DIAGNOSIS — M25512 Pain in left shoulder: Secondary | ICD-10-CM | POA: Diagnosis not present

## 2019-05-09 DIAGNOSIS — M25612 Stiffness of left shoulder, not elsewhere classified: Secondary | ICD-10-CM

## 2019-05-09 DIAGNOSIS — M6281 Muscle weakness (generalized): Secondary | ICD-10-CM

## 2019-05-09 DIAGNOSIS — R29898 Other symptoms and signs involving the musculoskeletal system: Secondary | ICD-10-CM

## 2019-05-09 NOTE — Therapy (Signed)
[facility] [address]  [place], [place], [address] Phone: [phone]   Fax:  [phone]  Physical Therapy Treatment  Patient Details  Name: Shannon Delacruz MRN: [id number] Date of Birth: 11-Dec-1960 Referring Provider (PT): Vonna Drafts, MD   Encounter Date: 05/09/2019  PT End of Session - 05/09/19 1200    Visit Number  15    Number of Visits  22    Date for PT Re-Evaluation  06/01/19    Authorization Type  [employer] Medicare & Medicaid    PT Start Time  0931    PT Stop Time  1013    PT Time Calculation (min)  42 min    Activity Tolerance  Patient tolerated treatment well;Patient limited by pain    Behavior During Therapy  [facility] for tasks assessed/performed       Past Medical History:  Diagnosis Date  . Cancer ([place])    UTERINE  . DDD (degenerative disc disease), lumbar   . Osteoarthritis     Past Surgical History:  Procedure Laterality Date  . ABDOMINAL HYSTERECTOMY    . APPENDECTOMY    . BACK SURGERY  2015  . HIP SURGERY      Vitals:   05/09/19 0931 05/09/19 0943 05/09/19 1011  BP: (!) 140/98 (!) 143/100 (!) 145/90  Pulse: 74    SpO2: 98%      Subjective Assessment - 05/09/19 0931    Subjective  Reports that her MD advised her to come in yesterday d/t high BP, dizziness, HA, and hearburn. Had labs and an EKG done and this AM it was 165/161mHg but she feels fine.    Pertinent History  OA, lumbar DDD, uterine CA, hip surgery, back surgery    Patient Stated Goals  be able to put L arm behind back    Currently in Pain?  Yes    Pain Score  4     Pain Location  Shoulder    Pain Orientation  Left    Pain Descriptors / Indicators  Aching    Pain Type  Acute pain;Surgical pain                       [facility] Adult PT Treatment/Exercise - 05/09/19 0001      Shoulder Exercises: Seated   Flexion  AAROM;Left;10 reps    Flexion Limitations  with wand to tolerance    Abduction  AAROM;Left;10  reps    ABduction Limitations  with wand to tolerance    Other Seated Exercises  L shoulder scaption x10   manual cues to scapular upward rotation and depression   Other Seated Exercises  L shoulder flexion AROM to tolerance x10 with cues to avoid shoulder hiking      Manual Therapy   Manual Therapy  Passive ROM;Soft tissue mobilization    Manual therapy comments  sitting    Soft tissue mobilization  STM to L proximal and distal biceps muscle belly- most tenderness on proximal end    Passive ROM  L shoulder elbow extension and supination PROM              PT Education - 05/09/19 1013    Education Details  update to HEP; discussion on patient's symptoms and BP levels    Person(s) Educated  Patient    Methods  Explanation;Demonstration;Tactile cues;Verbal cues;Handout    Comprehension  Verbalized understanding;Returned demonstration       PT Short Term Goals -  04/20/19 0941      PT SHORT TERM GOAL #1   Title  Patient to be independent with initial HEP.    Time  3    Period  Weeks    Status  Achieved    Target Date  04/05/19        PT Long Term Goals - 04/20/19 0941      PT LONG TERM GOAL #1   Title  Patient to be independent with advanced HEP.    Time  6    Period  Weeks    Status  Partially Met   met for current   Target Date  06/01/19      PT LONG TERM GOAL #2   Title  Patient to demonstrate L shoulder AROM/PROM [facility] and without pain limiting.    Time  6    Period  Weeks    Status  Partially Met   Patient has demonstrated improvements in L shoulder PROM in all planes. Now able to tolerate AAROM in all planes with use of wand.   Target Date  06/01/19      PT LONG TERM GOAL #3   Title  Patient to demonstrate L shoulder strength >=4+/5.    Time  6    Period  Weeks    Status  On-going   NT d/t pain   Target Date  06/01/19      PT LONG TERM GOAL #4   Title  Pt will be able to reach behind her back and to lift carry groceries with less than 1-2/10 pain.      Time  6    Period  Weeks    Status  On-going   NT d/t surgical precautions   Target Date  06/01/19            Plan - 05/09/19 1200    Clinical Impression Statement  Patient arrived to session with request to check BP this AM d/t seeing MD yesterday for high BP, dizziness, HAs, and heartburn. Had EKG and labs done yesterday and is waiting for results. Patient hypertensive at beginning of session, but safe for therapy. Worked on sitting/standing exercises this session to avoid BP spike. Patient required manual cues to promote depression and upward rotation of scapula with AROM. Worked on L elbow extension and supination PROM d/t visible asymmetrical elbow extension between both sides. Patient responded well to manual therapy, with observable improvement in elbow extension AROM after PROM and STM to biceps. Updated HEP with elbow PROM and AROM- patient reported understanding and with no complaints at end of session.    Comorbidities  OA, lumbar DDD, uterine CA, hip surgery, back surgery    PT Frequency  2x / week    PT Duration  6 weeks    PT Treatment/Interventions  ADLs/Self Care Home Management;Cryotherapy;Electrical Stimulation;Moist Heat;Therapeutic exercise;Therapeutic activities;Functional mobility training;Ultrasound;Neuromuscular re-education;Manual techniques;Vasopneumatic Device;Taping;Splinting;Energy conservation;Dry needling;Passive range of motion;Scar mobilization    PT Next Visit Plan  improve L shoulder ROM and strength    Consulted and Agree with Plan of Care  Patient       Patient will benefit from skilled therapeutic intervention in order to improve the following deficits and impairments:  Hypomobility, Increased edema, Decreased scar mobility, Decreased knowledge of precautions, Decreased activity tolerance, Decreased strength, Impaired UE functional use, Pain, Decreased range of motion, Improper body mechanics, Postural dysfunction, Impaired flexibility  Visit  Diagnosis: Acute pain of left shoulder  Stiffness of left shoulder, not elsewhere classified  Muscle  weakness (generalized)  Other symptoms and signs involving the musculoskeletal system     Problem List There are no active problems to display for this patient.    Janene Harvey, PT, DPT 05/09/19 12:06 PM   [facility] [address]  [place], [place], [address] Phone: [phone]   Fax:  [phone]  Name: Shannon Delacruz MRN: [id number] Date of Birth: 05/30/1961

## 2019-05-11 ENCOUNTER — Ambulatory Visit: Payer: Medicare Other | Attending: Orthopedic Surgery | Admitting: Physical Therapy

## 2019-05-11 ENCOUNTER — Other Ambulatory Visit: Payer: Self-pay

## 2019-05-11 ENCOUNTER — Encounter: Payer: Self-pay | Admitting: Physical Therapy

## 2019-05-11 VITALS — BP 150/88 | HR 75

## 2019-05-11 DIAGNOSIS — R29898 Other symptoms and signs involving the musculoskeletal system: Secondary | ICD-10-CM | POA: Diagnosis present

## 2019-05-11 DIAGNOSIS — M6281 Muscle weakness (generalized): Secondary | ICD-10-CM | POA: Insufficient documentation

## 2019-05-11 DIAGNOSIS — M25512 Pain in left shoulder: Secondary | ICD-10-CM | POA: Insufficient documentation

## 2019-05-11 DIAGNOSIS — M25612 Stiffness of left shoulder, not elsewhere classified: Secondary | ICD-10-CM | POA: Diagnosis present

## 2019-05-11 NOTE — Therapy (Signed)
[facility] [address]  [place], [place], [address] Phone: [phone]   Fax:  [phone]  Physical Therapy Treatment  Patient Details  Name: Shannon Delacruz MRN: [id number] Date of Birth: 1960-12-29 Referring Provider (PT): Vonna Drafts, MD   Encounter Date: 05/11/2019  PT End of Session - 05/11/19 1157    Visit Number  16    Number of Visits  22    Date for PT Re-Evaluation  06/01/19    Authorization Type  [employer]HC Medicare & Medicaid    PT Start Time  0933    PT Stop Time  1016    PT Time Calculation (min)  43 min    Activity Tolerance  Patient tolerated treatment well;Patient limited by pain    Behavior During Therapy  [facility] for tasks assessed/performed       Past Medical History:  Diagnosis Date  . Cancer ([place])    UTERINE  . DDD (degenerative disc disease), lumbar   . Osteoarthritis     Past Surgical History:  Procedure Laterality Date  . ABDOMINAL HYSTERECTOMY    . APPENDECTOMY    . BACK SURGERY  2015  . HIP SURGERY      Vitals:   05/11/19 0936  BP: (!) 150/88  Pulse: 75  SpO2: 97%    Subjective Assessment - 05/11/19 0933    Subjective  Reports that she got her results back and her BP is not d/t her thyroid levels. Is feeling better today. BP this AM 140/47mHg.    Pertinent History  OA, lumbar DDD, uterine CA, hip surgery, back surgery    Patient Stated Goals  be able to put L arm behind back    Currently in Pain?  Yes    Pain Score  2     Pain Location  Shoulder    Pain Orientation  Left    Pain Descriptors / Indicators  Aching    Pain Type  Acute pain;Surgical pain                       [facility] Adult PT Treatment/Exercise - 05/11/19 0001      Shoulder Exercises: Supine   Protraction  Strengthening;10 reps;Weights    Protraction Weight (lbs)  4    Protraction Limitations  2x10; good tolerance    Flexion  AROM;Left;10 reps    Shoulder Flexion Weight (lbs)  1    Flexion  Limitations  with pillow under L arm      Shoulder Exercises: Seated   Other Seated Exercises  L shoulder orange pball rollouts 10x3" flexion, 10x3" scaption, 10x3" abduction to tolerance      Shoulder Exercises: Sidelying   External Rotation  Strengthening;Left;Weights;10 reps    External Rotation Weight (lbs)  2    External Rotation Limitations  2x10; towel under elbow; stopping at neutral    ABduction  AROM;Left;10 reps    ABduction Weight (lbs)  1    ABduction Limitations  thumb u      Shoulder Exercises: Standing   External Rotation  Strengthening;Left;Theraband;15 reps    Theraband Level (Shoulder External Rotation)  Level 1 (Yellow)    External Rotation Limitations  dowel under elbow; cues to maintain neutral    Internal Rotation  Strengthening;Left;Theraband;15 reps    Theraband Level (Shoulder Internal Rotation)  Level 1 (Yellow)    Internal Rotation Limitations  dowel under elbow; cues to maintain neutral    Row  Strengthening;Both;10  reps;Theraband    Theraband Level (Shoulder Row)  Level 1 (Yellow)    Row Limitations  2x10; cues for scapular depression             PT Education - 05/11/19 1157    Education Details  update to HEP    Person(s) Educated  Patient    Methods  Explanation;Demonstration;Tactile cues;Verbal cues;Handout    Comprehension  Verbalized understanding;Returned demonstration       PT Short Term Goals - 04/20/19 0941      PT SHORT TERM GOAL #1   Title  Patient to be independent with initial HEP.    Time  3    Period  Weeks    Status  Achieved    Target Date  04/05/19        PT Long Term Goals - 04/20/19 0941      PT LONG TERM GOAL #1   Title  Patient to be independent with advanced HEP.    Time  6    Period  Weeks    Status  Partially Met   met for current   Target Date  06/01/19      PT LONG TERM GOAL #2   Title  Patient to demonstrate L shoulder AROM/PROM [facility] and without pain limiting.    Time  6    Period  Weeks     Status  Partially Met   Patient has demonstrated improvements in L shoulder PROM in all planes. Now able to tolerate AAROM in all planes with use of wand.   Target Date  06/01/19      PT LONG TERM GOAL #3   Title  Patient to demonstrate L shoulder strength >=4+/5.    Time  6    Period  Weeks    Status  On-going   NT d/t pain   Target Date  06/01/19      PT LONG TERM GOAL #4   Title  Pt will be able to reach behind her back and to lift carry groceries with less than 1-2/10 pain.     Time  6    Period  Weeks    Status  On-going   NT d/t surgical precautions   Target Date  06/01/19            Plan - 05/11/19 1157    Clinical Impression Statement  Patient reporting improvement in BP and symptoms since last session. Worked on L shoulder AAROM to tolerance with assistance of physioball. Patient still with guarding, quite limited ROM, and pain at end ranges with AAROM. Able to tolerate addition of light weighted resistance with supine flexion and sidelying abduction today. Maintained good form with sidelying ER and with good tolerance. Overall performed well with light banded resistance exercises for RTC and periscapular strengthening. D/t patient's improvement in tolerance for resistance exercises today, updated HEP with several of these activities. Patient reported understanding and with no complaints at end of session. Patient progressing towards goals.    Comorbidities  OA, lumbar DDD, uterine CA, hip surgery, back surgery    PT Frequency  2x / week    PT Duration  6 weeks    PT Treatment/Interventions  ADLs/Self Care Home Management;Cryotherapy;Electrical Stimulation;Moist Heat;Therapeutic exercise;Therapeutic activities;Functional mobility training;Ultrasound;Neuromuscular re-education;Manual techniques;Vasopneumatic Device;Taping;Splinting;Energy conservation;Dry needling;Passive range of motion;Scar mobilization    PT Next Visit Plan  improve L shoulder ROM and strength     Consulted and Agree with Plan of Care  Patient  Patient will benefit from skilled therapeutic intervention in order to improve the following deficits and impairments:  Hypomobility, Increased edema, Decreased scar mobility, Decreased knowledge of precautions, Decreased activity tolerance, Decreased strength, Impaired UE functional use, Pain, Decreased range of motion, Improper body mechanics, Postural dysfunction, Impaired flexibility  Visit Diagnosis: Acute pain of left shoulder  Stiffness of left shoulder, not elsewhere classified  Muscle weakness (generalized)  Other symptoms and signs involving the musculoskeletal system     Problem List There are no active problems to display for this patient.    Shannon Delacruz, PT, DPT 05/11/19 12:00 PM   [facility] [address]  [place], [place], [address] Phone: [phone]   Fax:  [phone]  Name: Shannon Delacruz MRN: [id number] Date of Birth: Sep 12, 1960

## 2019-05-17 ENCOUNTER — Other Ambulatory Visit: Payer: Self-pay

## 2019-05-17 ENCOUNTER — Encounter: Payer: Self-pay | Admitting: Physical Therapy

## 2019-05-17 ENCOUNTER — Ambulatory Visit: Payer: Medicare Other | Admitting: Physical Therapy

## 2019-05-17 DIAGNOSIS — M25512 Pain in left shoulder: Secondary | ICD-10-CM | POA: Diagnosis not present

## 2019-05-17 DIAGNOSIS — R29898 Other symptoms and signs involving the musculoskeletal system: Secondary | ICD-10-CM

## 2019-05-17 DIAGNOSIS — M25612 Stiffness of left shoulder, not elsewhere classified: Secondary | ICD-10-CM

## 2019-05-17 DIAGNOSIS — M6281 Muscle weakness (generalized): Secondary | ICD-10-CM

## 2019-05-17 NOTE — Therapy (Signed)
[facility] [address] [place] [address]  [place], [place], [address] Phone: [phone]   Fax:  [phone]  Physical Therapy Treatment  Patient Details  Name: Shannon Delacruz MRN: [id number] Date of Birth: 1961-03-31 Referring Provider (PT): Vonna Drafts, MD   Encounter Date: 05/17/2019  PT End of Session - 05/17/19 1059    Visit Number  17    Number of Visits  22    Date for PT Re-Evaluation  06/01/19    Authorization Type  UHC Medicare & Medicaid    PT Start Time  1013    PT Stop Time  1058    PT Time Calculation (min)  45 min    Activity Tolerance  Patient tolerated treatment well;Patient limited by pain    Behavior During Therapy  [facility] for tasks assessed/performed       Past Medical History:  Diagnosis Date  . Cancer ([place])    UTERINE  . DDD (degenerative disc disease), lumbar   . Osteoarthritis     Past Surgical History:  Procedure Laterality Date  . ABDOMINAL HYSTERECTOMY    . APPENDECTOMY    . BACK SURGERY  2015  . HIP SURGERY      There were no vitals filed for this visit.  Subjective Assessment - 05/17/19 1017    Subjective  Went to a pool party this weekend and is tired. Had limited compliuance with HEP over the weekend.    Pertinent History  OA, lumbar DDD, uterine CA, hip surgery, back surgery    Patient Stated Goals  be able to put L arm behind back    Currently in Pain?  No/denies                       [facility] Adult PT Treatment/Exercise - 05/17/19 0001      Shoulder Exercises: Supine   Protraction  Strengthening;10 reps;Weights    Protraction Weight (lbs)  4    Protraction Limitations  2x10    Flexion  AROM;Left;10 reps    Shoulder Flexion Weight (lbs)  1, 2    Flexion Limitations  with pillow under L arm; 1st set 10x 1#, 2nd set 5x 2#      Shoulder Exercises: Seated   Flexion  AROM;Left;10 reps    Flexion Limitations  to 90 deg to avoid shoulder hike    Abduction  AROM;Left;10  reps    ABduction Limitations  within very limited ROM to avoid shoulder hiking    Other Seated Exercises  L shoulder scaption AROM x10   to 90 deg to avoid shoulder hike     Shoulder Exercises: Sidelying   External Rotation  Strengthening;Left;Weights;10 reps    External Rotation Weight (lbs)  2    External Rotation Limitations  2x10; towel under elbow; stopping at neutral    ABduction  AROM;Left;10 reps    ABduction Weight (lbs)  1, 2    ABduction Limitations  thumb up; 1st ser 5x 1#, 2nd set 10x 2#   cues to stretch to end range     Shoulder Exercises: Standing   External Rotation  Strengthening;Left;Theraband;10 reps    Theraband Level (Shoulder External Rotation)  Level 2 (Red)    External Rotation Limitations  towel under elbow; cues to maintain neutral    Internal Rotation  Strengthening;Left;Theraband;10 reps    Theraband Level (Shoulder Internal Rotation)  Level 2 (Red)    Internal Rotation Limitations  towel under elbow; cues  to maintain neutral    Row  Strengthening;Both;Theraband;15 reps    Theraband Level (Shoulder Row)  Level 2 (Red)    Row Limitations  cues for form      Shoulder Exercises: Pulleys   Flexion  3 minutes    Flexion Limitations  to tolerance    Scaption  3 minutes    Scaption Limitations  to tolerance      Manual Therapy   Manual Therapy  Passive ROM    Manual therapy comments  supine    Passive ROM  L shoulder elbow extension and supination PROM, L shoulder PROM to tolerance in all directions             PT Education - 05/17/19 1057    Education Details  advised to perform row and banded IR/ER with red TB    Person(s) Educated  Patient    Methods  Explanation;Demonstration;Tactile cues;Verbal cues    Comprehension  Verbalized understanding;Returned demonstration       PT Short Term Goals - 04/20/19 0941      PT SHORT TERM GOAL #1   Title  Patient to be independent with initial HEP.    Time  3    Period  Weeks    Status  Achieved     Target Date  04/05/19        PT Long Term Goals - 04/20/19 0941      PT LONG TERM GOAL #1   Title  Patient to be independent with advanced HEP.    Time  6    Period  Weeks    Status  Partially Met   met for current   Target Date  06/01/19      PT LONG TERM GOAL #2   Title  Patient to demonstrate L shoulder AROM/PROM [facility] and without pain limiting.    Time  6    Period  Weeks    Status  Partially Met   Patient has demonstrated improvements in L shoulder PROM in all planes. Now able to tolerate AAROM in all planes with use of wand.   Target Date  06/01/19      PT LONG TERM GOAL #3   Title  Patient to demonstrate L shoulder strength >=4+/5.    Time  6    Period  Weeks    Status  On-going   NT d/t pain   Target Date  06/01/19      PT LONG TERM GOAL #4   Title  Pt will be able to reach behind her back and to lift carry groceries with less than 1-2/10 pain.     Time  6    Period  Weeks    Status  On-going   NT d/t surgical precautions   Target Date  06/01/19            Plan - 05/17/19 1100    Clinical Impression Statement  Patient without complaints at beginning of session. Admitted to limited compliance with HEP over the weekend. Began session with L shoulder and elbow PROM to tolerance. L shoulder visibly stiff this AM but with improvement in PROM after manual therapy. Patient tolerating supine overhead flexion with 1lb well, thus increased to 2lbs with more difficulty and c/o mild UT soreness. Continued to tolerate progressive shoulder girdle strengthening ther-ex on mat well. Worked on sitting AROM within limited range to avoid shoulder hiking, as patient still with this tendency. Ended session without complaints. Patient progressing well towards goals.  Comorbidities  OA, lumbar DDD, uterine CA, hip surgery, back surgery    PT Frequency  2x / week    PT Duration  6 weeks    PT Treatment/Interventions  ADLs/Self Care Home Management;Cryotherapy;Electrical  Stimulation;Moist Heat;Therapeutic exercise;Therapeutic activities;Functional mobility training;Ultrasound;Neuromuscular re-education;Manual techniques;Vasopneumatic Device;Taping;Splinting;Energy conservation;Dry needling;Passive range of motion;Scar mobilization    PT Next Visit Plan  improve L shoulder ROM and strength    Consulted and Agree with Plan of Care  Patient       Patient will benefit from skilled therapeutic intervention in order to improve the following deficits and impairments:  Hypomobility, Increased edema, Decreased scar mobility, Decreased knowledge of precautions, Decreased activity tolerance, Decreased strength, Impaired UE functional use, Pain, Decreased range of motion, Improper body mechanics, Postural dysfunction, Impaired flexibility  Visit Diagnosis: Acute pain of left shoulder  Stiffness of left shoulder, not elsewhere classified  Muscle weakness (generalized)  Other symptoms and signs involving the musculoskeletal system     Problem List There are no active problems to display for this patient.    Janene Harvey, PT, DPT 05/17/19 11:02 AM   [facility] [address]  [place], [place], [address] Phone: [phone]   Fax:  [phone]  Name: Sinaya Minogue MRN: [id number] Date of Birth: 09-10-1960

## 2019-05-20 ENCOUNTER — Other Ambulatory Visit: Payer: Self-pay

## 2019-05-20 ENCOUNTER — Encounter: Payer: Self-pay | Admitting: Physical Therapy

## 2019-05-20 ENCOUNTER — Ambulatory Visit: Payer: Medicare Other | Admitting: Physical Therapy

## 2019-05-20 DIAGNOSIS — M6281 Muscle weakness (generalized): Secondary | ICD-10-CM

## 2019-05-20 DIAGNOSIS — M25512 Pain in left shoulder: Secondary | ICD-10-CM | POA: Diagnosis not present

## 2019-05-20 DIAGNOSIS — M25612 Stiffness of left shoulder, not elsewhere classified: Secondary | ICD-10-CM

## 2019-05-20 DIAGNOSIS — R29898 Other symptoms and signs involving the musculoskeletal system: Secondary | ICD-10-CM

## 2019-05-20 NOTE — Therapy (Signed)
[facility] [address]  [place], [place], [address] Phone: [phone]   Fax:  [phone]  Physical Therapy Treatment  Patient Details  Name: Shannon Delacruz MRN: [id number] Date of Birth: January 01, 1961 Referring Provider (PT): Vonna Drafts, MD   Encounter Date: 05/20/2019  PT End of Session - 05/20/19 0928    Visit Number  18    Number of Visits  22    Date for PT Re-Evaluation  06/01/19    Authorization Type  [employer]HC Medicare & Medicaid    PT Start Time  0847    PT Stop Time  0927    PT Time Calculation (min)  40 min    Activity Tolerance  Patient tolerated treatment well;Patient limited by pain    Behavior During Therapy  [facility] for tasks assessed/performed       Past Medical History:  Diagnosis Date  . Cancer ([place])    UTERINE  . DDD (degenerative disc disease), lumbar   . Osteoarthritis     Past Surgical History:  Procedure Laterality Date  . ABDOMINAL HYSTERECTOMY    . APPENDECTOMY    . BACK SURGERY  2015  . HIP SURGERY      There were no vitals filed for this visit.  Subjective Assessment - 05/20/19 0849    Subjective  Planning to work out at [employer] later today.    Pertinent History  OA, lumbar DDD, uterine CA, hip surgery, back surgery    Patient Stated Goals  be able to put L arm behind back    Currently in Pain?  Yes    Pain Score  2     Pain Location  Shoulder    Pain Orientation  Left    Pain Descriptors / Indicators  Aching    Pain Type  Acute pain;Surgical pain                       [facility] Adult PT Treatment/Exercise - 05/20/19 0001      Shoulder Exercises: Supine   Protraction  Strengthening;10 reps;Weights    Protraction Weight (lbs)  5    Protraction Limitations  2x10   decreased ROM on L UE   Flexion  Left;Strengthening;5 reps    Shoulder Flexion Weight (lbs)  2    Flexion Limitations  with pillow under L arm; 3x5 2#   limited by pain without pillow under arm      Shoulder Exercises: Seated   External Rotation  Strengthening;Both;10 reps;Theraband    Theraband Level (Shoulder External Rotation)  Level 1 (Yellow)    External Rotation Limitations  2x10; B ER with elbows in   cues for symmetrical ROM     Shoulder Exercises: Prone   Flexion  Strengthening;Left;10 reps    Flexion Limitations  prone flexion while leaning over counter   manual cues for scapular upward rotation and depression   Horizontal ABduction 1  Strengthening;Left;10 reps    Horizontal ABduction 1 Limitations  prone T while leaning over counter    Other Prone Exercises  L prone Y leaning over counter x10   cues for shoulder depression     Shoulder Exercises: Sidelying   External Rotation  Strengthening;Left;Weights;10 reps    External Rotation Weight (lbs)  2    External Rotation Limitations  x15; stopping at neutral    ABduction  AROM;Left;10 reps    ABduction Weight (lbs)  2    ABduction Limitations  x10  2#; thumb up      Shoulder Exercises: ROM/Strengthening   UBE (Upper Arm Bike)  L1 63mn forward/3 min back      Manual Therapy   Manual Therapy  Passive ROM    Manual therapy comments  supine    Passive ROM  L shoulder PROM to tolerance in all directions with prolonged holds             PT Education - 05/20/19 0927    Education Details  update to HEP    Person(s) Educated  Patient    Methods  Explanation;Demonstration;Tactile cues;Verbal cues;Handout    Comprehension  Verbalized understanding;Returned demonstration       PT Short Term Goals - 04/20/19 0941      PT SHORT TERM GOAL #1   Title  Patient to be independent with initial HEP.    Time  3    Period  Weeks    Status  Achieved    Target Date  04/05/19        PT Long Term Goals - 04/20/19 0941      PT LONG TERM GOAL #1   Title  Patient to be independent with advanced HEP.    Time  6    Period  Weeks    Status  Partially Met   met for current   Target Date  06/01/19      PT LONG TERM  GOAL #2   Title  [facility] to [facility].    Time  6    Period  Weeks    Status  Partially Met   Patient has demonstrated improvements in L shoulder PROM in all planes. Now able to tolerate AAROM in all planes with use of wand.   Target Date  06/01/19      PT LONG TERM GOAL #3   Title  Patient to demonstrate L shoulder strength >=4+/5.    Time  6    Period  Weeks    Status  On-going   NT d/t pain   Target Date  06/01/19      PT LONG TERM GOAL #4   Title  Pt will be able to reach behind her back and to lift carry groceries with less than 1-2/10 pain.     Time  6    Period  Weeks    Status  On-going   NT d/t surgical precautions   Target Date  06/01/19            Plan - 05/20/19 07622   Clinical Impression Statement  Patient reporting no new complaints. Tolerate UBE for warm up today. Worked on gentle PROM at beginning of session before proceeding with strengthening ther-ex. Continued with supine overhead flexion with 2lbs with some difficulty. Increased weighted resistance with serratus pinches with decreased ROM on L UE. Better performance observed with second set. Introduced prone periscapular strengthening with patient requiring VC's and TC's to promote upward scapular rotation and depression. Patient with good form with B ER with banded resistance, thus update HEP with this exercise. Patient reported understanding and with no complaints at end of session. Patient progressing well towards goals.    Comorbidities  OA, lumbar DDD, uterine CA, hip surgery, back surgery    PT Frequency  2x / week    PT Duration  6 weeks    PT Treatment/Interventions  ADLs/Self Care Home Management;Cryotherapy;Electrical Stimulation;Moist Heat;Therapeutic exercise;Therapeutic activities;Functional mobility training;Ultrasound;Neuromuscular re-education;Manual techniques;Vasopneumatic Device;Taping;Splinting;Energy conservation;Dry needling;Passive  range of motion;Scar mobilization    PT Next Visit Plan  improve L shoulder ROM and strength    Consulted and Agree with Plan of Care  Patient       Patient will benefit from skilled therapeutic intervention in order to improve the following deficits and impairments:  Hypomobility, Increased edema, Decreased scar mobility, Decreased knowledge of precautions, Decreased activity tolerance, Decreased strength, Impaired UE functional use, Pain, Decreased range of motion, Improper body mechanics, Postural dysfunction, Impaired flexibility  Visit Diagnosis: Acute pain of left shoulder  Stiffness of left shoulder, not elsewhere classified  Muscle weakness (generalized)  Other symptoms and signs involving the musculoskeletal system     Problem List There are no active problems to display for this patient.    Janene Harvey, PT, DPT 05/20/19 9:30 AM   [facility] [address]  [place], [place], [address] Phone: [phone]   Fax:  [phone]  Name: Perry Molla MRN: [id number] Date of Birth: 08/20/1961

## 2019-05-23 ENCOUNTER — Encounter: Payer: Self-pay | Admitting: Physical Therapy

## 2019-05-23 ENCOUNTER — Ambulatory Visit: Payer: Medicare Other | Admitting: Physical Therapy

## 2019-05-23 ENCOUNTER — Other Ambulatory Visit: Payer: Self-pay

## 2019-05-23 DIAGNOSIS — M25512 Pain in left shoulder: Secondary | ICD-10-CM

## 2019-05-23 DIAGNOSIS — R29898 Other symptoms and signs involving the musculoskeletal system: Secondary | ICD-10-CM

## 2019-05-23 DIAGNOSIS — M25612 Stiffness of left shoulder, not elsewhere classified: Secondary | ICD-10-CM

## 2019-05-23 DIAGNOSIS — M6281 Muscle weakness (generalized): Secondary | ICD-10-CM

## 2019-05-23 NOTE — Therapy (Signed)
[facility] [address]  [place], [place], [address] Phone: [phone]   Fax:  [phone]  Physical Therapy Treatment  Patient Details  Name: Shannon Delacruz MRN: [id number] Date of Birth: Oct 04, 1960 Referring Provider (PT): Vonna Drafts, MD   Encounter Date: 05/23/2019  PT End of Session - 05/23/19 1216    Visit Number  19    Number of Visits  22    Date for PT Re-Evaluation  06/01/19    Authorization Type  UHC Medicare & Medicaid    PT Start Time  1015    PT Stop Time  1108    PT Time Calculation (min)  53 min    Activity Tolerance  Patient tolerated treatment well;Patient limited by pain    Behavior During Therapy  [facility] for tasks assessed/performed       Past Medical History:  Diagnosis Date  . Cancer ([place])    UTERINE  . DDD (degenerative disc disease), lumbar   . Osteoarthritis     Past Surgical History:  Procedure Laterality Date  . ABDOMINAL HYSTERECTOMY    . APPENDECTOMY    . BACK SURGERY  2015  . HIP SURGERY      There were no vitals filed for this visit.  Subjective Assessment - 05/23/19 1017    Subjective  L shoulder is bothering her today- probably because she has been doing things with it at home. Did some laundry, cleaning, and rode on the motorcycle.    Pertinent History  OA, lumbar DDD, uterine CA, hip surgery, back surgery    Patient Stated Goals  be able to put L arm behind back    Currently in Pain?  Yes    Pain Score  4     Pain Location  Shoulder    Pain Orientation  Left    Pain Descriptors / Indicators  Aching;Sharp    Pain Type  Acute pain;Surgical pain                       [facility] Adult PT Treatment/Exercise - 05/23/19 0001      Shoulder Exercises: Supine   Flexion  Left;Strengthening;10 reps    Shoulder Flexion Weight (lbs)  1    Flexion Limitations  with pillow under L arm      Shoulder Exercises: Sidelying   External Rotation   Strengthening;Left;Weights;10 reps    External Rotation Weight (lbs)  1    External Rotation Limitations  x15; stopping at neutral    ABduction  Strengthening;Left;10 reps;Weights    ABduction Weight (lbs)  1    ABduction Limitations  x10 1#; thumb up      Shoulder Exercises: Standing   Protraction  Strengthening;Both;10 reps    Protraction Limitations  2x10; serratus punch on wall   cues to relax shoulders down   Other Standing Exercises  L shoulder ~80 deg abduction ball on wall circles 10x CW/10x CCW    Other Standing Exercises  L shoulder 90 deg flexion ball on wall circles 10x CW/10x CCW      Shoulder Exercises: ROM/Strengthening   UBE (Upper Arm Bike)  L1 80mn forward/3 min back      Vasopneumatic   Number Minutes Vasopneumatic   10 minutes    Vasopnuematic Location   Shoulder   L   Vasopneumatic Pressure  Low    Vasopneumatic Temperature   50      Manual Therapy   Manual Therapy  Passive ROM    Manual therapy comments  supine    Soft tissue mobilization  STM to L biceps muscle belly and proximal biceps tendon- painful trigger pts throughout    Myofascial Release  manual TPR to L biceps muscle belly    Passive ROM  L shoulder PROM to tolerance in all directions with prolonged holds at end range and gentle distraction to improve comfort   pt reporting increased pain today            PT Education - 05/23/19 1219    Education Details  edu on avoidance of heavy lifting s/p L biceps tenodesis    Person(s) Educated  Patient    Methods  Explanation;Demonstration    Comprehension  Verbalized understanding       PT Short Term Goals - 04/20/19 0941      PT SHORT TERM GOAL #1   Title  Patient to be independent with initial HEP.    Time  3    Period  Weeks    Status  Achieved    Target Date  04/05/19        PT Long Term Goals - 04/20/19 0941      PT LONG TERM GOAL #1   Title  Patient to be independent with advanced HEP.    Time  6    Period  Weeks    Status   Partially Met   met for current   Target Date  06/01/19      PT LONG TERM GOAL #2   Title  Patient to demonstrate L shoulder AROM/PROM [facility] and without pain limiting.    Time  6    Period  Weeks    Status  Partially Met   Patient has demonstrated improvements in L shoulder PROM in all planes. Now able to tolerate AAROM in all planes with use of wand.   Target Date  06/01/19      PT LONG TERM GOAL #3   Title  Patient to demonstrate L shoulder strength >=4+/5.    Time  6    Period  Weeks    Status  On-going   NT d/t pain   Target Date  06/01/19      PT LONG TERM GOAL #4   Title  Pt will be able to reach behind her back and to lift carry groceries with less than 1-2/10 pain.     Time  6    Period  Weeks    Status  On-going   NT d/t surgical precautions   Target Date  06/01/19            Plan - 05/23/19 1216    Clinical Impression Statement  Patient reporting increased pain in L shoulder over the weekend, with some remaining this AM. Notes that she was doing laundry, cleaning, and rode on a motorcycle over the weekend. Patient tolerated gentle L shoulder PROM with report of increased pain at end ranges. Proceeded with STM and manual TPR to L biceps muscle belly and proximal biceps tendon. Patient demonstrated painful trigger points throughout. Upon questioning, patient did admit to lifting 20-30lb dogs over the weekend, possibly causing biceps irritation. Worked on serratus strengthening against wall with patient reporting muscle fatigue but able to tolerate these exercises with addition of verbal/tactile cues for form. Ended session with [employer] to L shoulder for pain relief. No complaints at end of session.    Comorbidities  OA, lumbar DDD, uterine CA, hip surgery, back surgery  PT Frequency  2x / week    PT Duration  6 weeks    PT Treatment/Interventions  ADLs/Self Care Home Management;Cryotherapy;Electrical Stimulation;Moist Heat;Therapeutic exercise;Therapeutic  activities;Functional mobility training;Ultrasound;Neuromuscular re-education;Manual techniques;Vasopneumatic Device;Taping;Splinting;Energy conservation;Dry needling;Passive range of motion;Scar mobilization    PT Next Visit Plan  improve L shoulder ROM and strength    Consulted and Agree with Plan of Care  Patient       Patient will benefit from skilled therapeutic intervention in order to improve the following deficits and impairments:  Hypomobility, Increased edema, Decreased scar mobility, Decreased knowledge of precautions, Decreased activity tolerance, Decreased strength, Impaired UE functional use, Pain, Decreased range of motion, Improper body mechanics, Postural dysfunction, Impaired flexibility  Visit Diagnosis: Acute pain of left shoulder  Stiffness of left shoulder, not elsewhere classified  Muscle weakness (generalized)  Other symptoms and signs involving the musculoskeletal system     Problem List There are no active problems to display for this patient.   Janene Harvey, PT, DPT 05/23/19 12:20 PM   [facility] [address]  [place], [place], [address] Phone: [phone]   Fax:  [phone]  Name: Shannon Delacruz MRN: [id number] Date of Birth: Aug 19, 1961

## 2019-05-26 ENCOUNTER — Other Ambulatory Visit: Payer: Self-pay

## 2019-05-26 ENCOUNTER — Ambulatory Visit: Payer: Medicare Other | Admitting: Physical Therapy

## 2019-05-26 ENCOUNTER — Encounter: Payer: Self-pay | Admitting: Physical Therapy

## 2019-05-26 DIAGNOSIS — M6281 Muscle weakness (generalized): Secondary | ICD-10-CM

## 2019-05-26 DIAGNOSIS — M25612 Stiffness of left shoulder, not elsewhere classified: Secondary | ICD-10-CM

## 2019-05-26 DIAGNOSIS — M25512 Pain in left shoulder: Secondary | ICD-10-CM

## 2019-05-26 DIAGNOSIS — R29898 Other symptoms and signs involving the musculoskeletal system: Secondary | ICD-10-CM

## 2019-05-26 NOTE — Therapy (Signed)
[facility] [address]  [place], [place], [address] Phone: [phone]   Fax:  [phone]  Physical Therapy Progress Note  Patient Details  Name: Shannon Delacruz MRN: [id number] Date of Birth: 1960/12/09 Referring Provider (PT): Vonna Drafts, MD   Progress Note Reporting Period 04/26/19 to 05/26/19  See note below for Objective Data and Assessment of Progress/Goals.     Encounter Date: 05/26/2019  PT End of Session - 05/26/19 1200    Visit Number  20    Number of Visits  28    Date for PT Re-Evaluation  06/23/19    Authorization Type  UHC Medicare & Medicaid    PT Start Time  1016    PT Stop Time  1056    PT Time Calculation (min)  40 min    Activity Tolerance  Patient tolerated treatment well;Patient limited by pain    Behavior During Therapy  [facility] for tasks assessed/performed       Past Medical History:  Diagnosis Date  . Cancer ([place])    UTERINE  . DDD (degenerative disc disease), lumbar   . Osteoarthritis     Past Surgical History:  Procedure Laterality Date  . ABDOMINAL HYSTERECTOMY    . APPENDECTOMY    . BACK SURGERY  2015  . HIP SURGERY      There were no vitals filed for this visit.  Subjective Assessment - 05/26/19 1019    Subjective  Feels like the weather is flaring up her shoulder this AM. Patient reports that she is getting better. Reports 65% improvement in L shoulder since initial eval. Better able to reach towards back and reach overhead.    Pertinent History  OA, lumbar DDD, uterine CA, hip surgery, back surgery    Patient Stated Goals  be able to put L arm behind back    Currently in Pain?  Yes    Pain Score  4     Pain Location  Shoulder    Pain Orientation  Left    Pain Descriptors / Indicators  Aching;Sharp    Pain Type  Acute pain;Surgical pain         [facility] PT Assessment - 05/26/19 0001      Assessment   Medical Diagnosis  L Unspecified RTC tear or rupture of L  shoulder, not specified as traumatic    Referring Provider (PT)  Vonna Drafts, MD    Onset Date/Surgical Date  03/03/19      Observation/Other Assessments   Focus on Therapeutic Outcomes (FOTO)   Shoulder: 58 (42% limited, 37% predicted)      AROM   Left Shoulder Flexion  104 Degrees    Left Shoulder ABduction  85 Degrees    Left Shoulder Internal Rotation  --   F IR top of L buttock   Left Shoulder External Rotation  --   FER C2     PROM   Left Shoulder Flexion  136 Degrees    Left Shoulder ABduction  90 Degrees   scaption for improved tolerance   Left Shoulder Internal Rotation  68 Degrees    Left Shoulder External Rotation  48 Degrees      Strength   Left Shoulder Flexion  3+/5   pain   Left Shoulder ABduction  3+/5    Left Shoulder Internal Rotation  3+/5    Left Shoulder External Rotation  3+/5   pain  [facility] Adult PT Treatment/Exercise - 05/26/19 0001      Shoulder Exercises: ROM/Strengthening   UBE (Upper Arm Bike)  L1.5 66mn forward/3 min back      Manual Therapy   Manual Therapy  Passive ROM    Manual therapy comments  supine    Passive ROM  L shoulder PROM to tolerance in all directions with prolonged holds at end range and gentle distraction to improve comfort   improved tolerance for IR            PT Education - 05/26/19 1159    Education Details  edu on objective progress with PT thus far and remaining impairments; edu on normal typical rehab course    Person(s) Educated  Patient    Methods  Explanation;Demonstration    Comprehension  Verbalized understanding       PT Short Term Goals - 05/26/19 1023      PT SHORT TERM GOAL #1   Title  Patient to be independent with initial HEP.    Time  3    Period  Weeks    Status  Achieved    Target Date  04/05/19        PT Long Term Goals - 05/26/19 1023      PT LONG TERM GOAL #1   Title  Patient to be independent with advanced HEP.    Time  4    Period  Weeks     Status  Partially Met   met for current   Target Date  06/23/19      PT LONG TERM GOAL #2   Title  [facility].    Time  4    Period  Weeks    Status  Partially Met   improvement in L shoulder PROM in all planes, now able to tolerate AROM but with limitations   Target Date  06/23/19      PT LONG TERM GOAL #3   Title  Patient to demonstrate L shoulder strength >=4+/5.    Time  4    Period  Weeks    Status  Partially Met   L shoulder strength 3+ in all planes   Target Date  06/23/19      PT LONG TERM GOAL #4   Title  Pt will be able to reach behind her back and to lift carry groceries with less than 1-2/10 pain.     Time  4    Period  Weeks    Status  Partially Met   limited by tightness with reaching behind back, 4/10 pain at worst with lifting light groceries   Target Date  06/23/19            Plan - 05/26/19 1204    Clinical Impression Statement  Patient reports that she is getting better. Reports 65% improvement in L shoulder since initial eval. Better able to reach towards back and reach overhead. Strength testing revealed L shoulder strength 3+ in all planes, demonstrating improvement in testing tolerance but showing remaining limitation. Patient has shown good improvement in L shoulder PROM in all planes and now able to tolerate AROM, but limited. Notes that she is now most limited by tightness when reaching behind back and reports 4/10 pain at worst with lifting light groceries. Patient tolerated PROM to L shoulder at beginning of session to improve stiffness and objective testing. Patient admits to not performing strength HEP d/t no longer attending  [employer]; planning to try [employer]. Advised patient that strengthening exercises should be high priority at this stage and to let us know if modifications need to be made to exercises (ie. Using theraband). Patient reported understanding. Patient is showing slow but  steady progress towards goals. Would benefit from continue skilled PT services 2x/week for 4 weeks to address remaining goals.    Comorbidities  OA, lumbar DDD, uterine CA, hip surgery, back surgery    PT Frequency  2x / week    PT Duration  4 weeks    PT Treatment/Interventions  ADLs/Self Care Home Management;Cryotherapy;Electrical Stimulation;Moist Heat;Therapeutic exercise;Therapeutic activities;Functional mobility training;Ultrasound;Neuromuscular re-education;Manual techniques;Vasopneumatic Device;Taping;Splinting;Energy conservation;Dry needling;Passive range of motion;Scar mobilization    PT Next Visit Plan  improve L shoulder ROM and strength    Consulted and Agree with Plan of Care  Patient       Patient will benefit from skilled therapeutic intervention in order to improve the following deficits and impairments:  Hypomobility, Increased edema, Decreased scar mobility, Decreased knowledge of precautions, Decreased activity tolerance, Decreased strength, Impaired UE functional use, Pain, Decreased range of motion, Improper body mechanics, Postural dysfunction, Impaired flexibility  Visit Diagnosis: Acute pain of left shoulder  Stiffness of left shoulder, not elsewhere classified  Muscle weakness (generalized)  Other symptoms and signs involving the musculoskeletal system     Problem List There are no active problems to display for this patient.    Janene Harvey, PT, DPT 05/26/19 12:13 PM   [facility] [address]  [place], [place], [address] Phone: [phone]   Fax:  [phone]  Name: Shannon Delacruz MRN: [id number] Date of Birth: March 15, 1961

## 2019-05-30 ENCOUNTER — Encounter: Payer: Self-pay | Admitting: Physical Therapy

## 2019-05-30 ENCOUNTER — Ambulatory Visit: Payer: Medicare Other | Admitting: Physical Therapy

## 2019-05-30 ENCOUNTER — Other Ambulatory Visit: Payer: Self-pay

## 2019-05-30 DIAGNOSIS — M6281 Muscle weakness (generalized): Secondary | ICD-10-CM

## 2019-05-30 DIAGNOSIS — M25512 Pain in left shoulder: Secondary | ICD-10-CM | POA: Diagnosis not present

## 2019-05-30 DIAGNOSIS — M25612 Stiffness of left shoulder, not elsewhere classified: Secondary | ICD-10-CM

## 2019-05-30 DIAGNOSIS — R29898 Other symptoms and signs involving the musculoskeletal system: Secondary | ICD-10-CM

## 2019-05-30 NOTE — Therapy (Signed)
[facility] [address]  [place], [place], [address] Phone: [phone]   Fax:  [phone]  Physical Therapy Treatment  Patient Details  Name: Shannon Delacruz MRN: [id number] Date of Birth: 03/17/1961 Referring Provider (PT): Vonna Drafts, MD   Encounter Date: 05/30/2019  PT End of Session - 05/30/19 1203    Visit Number  21    Number of Visits  28    Date for PT Re-Evaluation  06/23/19    Authorization Type  UHC Medicare & Medicaid    PT Start Time  1015    PT Stop Time  1054    PT Time Calculation (min)  39 min    Activity Tolerance  Patient tolerated treatment well;Patient limited by pain    Behavior During Therapy  [facility] for tasks assessed/performed       Past Medical History:  Diagnosis Date  . Cancer ([place])    UTERINE  . DDD (degenerative disc disease), lumbar   . Osteoarthritis     Past Surgical History:  Procedure Laterality Date  . ABDOMINAL HYSTERECTOMY    . APPENDECTOMY    . BACK SURGERY  2015  . HIP SURGERY      There were no vitals filed for this visit.  Subjective Assessment - 05/30/19 1017    Subjective  Reports that she had a trainer spot her with her exercises- used a 2.5 lb wt instead of 2 lbs and tolerated it well.Rode on motorcycle from 8am-5pm yesterday.    Pertinent History  OA, lumbar DDD, uterine CA, hip surgery, back surgery    Patient Stated Goals  be able to put L arm behind back    Currently in Pain?  Yes    Pain Score  4     Pain Location  Shoulder    Pain Orientation  Left    Pain Descriptors / Indicators  --   stiffness   Pain Type  Acute pain;Surgical pain                       [facility] Adult PT Treatment/Exercise - 05/30/19 0001      Shoulder Exercises: Supine   Flexion  Left;Strengthening;10 reps    Shoulder Flexion Weight (lbs)  2    Flexion Limitations  with pillow under L arm    Other Supine Exercises  L shoulder flexion with yellow TB x15    TB tied around L ankle; to tolerance   Other Supine Exercises  L shoulder AAROM flexion, scaption, IR/ER to tolerance x10 each    cues to stretch to tolernace     Shoulder Exercises: Sidelying   ABduction  Strengthening;Left;10 reps;Weights    ABduction Weight (lbs)  2    ABduction Limitations  x10 2#; thumb up    Other Sidelying Exercises  L sidelying abduction with yellow TB x10   TB tied around L ankle     Shoulder Exercises: ROM/Strengthening   UBE (Upper Arm Bike)  L1.5 44mn forward/3 min back      Manual Therapy   Manual Therapy  Passive ROM    Passive ROM  L shoulder PROM to tolerance in all directions with prolonged holds at end range and gentle distraction to improve comfort   slightly decreased PROM today            PT Education - 05/30/19 1203    Education Details  update to [facility] Educated  Patient    Methods  Explanation;Demonstration;Tactile cues;Verbal cues;Handout    Comprehension  Verbalized understanding;Returned demonstration       PT Short Term Goals - 05/26/19 1023      PT SHORT TERM GOAL #1   Title  Patient to be independent with initial HEP.    Time  3    Period  Weeks    Status  Achieved    Target Date  04/05/19        PT Long Term Goals - 05/26/19 1023      PT LONG TERM GOAL #1   Title  Patient to be independent with advanced HEP.    Time  4    Period  Weeks    Status  Partially Met   met for current   Target Date  06/23/19      PT LONG TERM GOAL #2   Title  Patient to demonstrate L shoulder AROM/PROM [facility] and without pain limiting.    Time  4    Period  Weeks    Status  Partially Met   improvement in L shoulder PROM in all planes, now able to tolerate AROM but with limitations   Target Date  06/23/19      PT LONG TERM GOAL #3   Title  Patient to demonstrate L shoulder strength >=4+/5.    Time  4    Period  Weeks    Status  Partially Met   L shoulder strength 3+ in all planes   Target Date  06/23/19      PT  LONG TERM GOAL #4   Title  Pt will be able to reach behind her back and to lift carry groceries with less than 1-2/10 pain.     Time  4    Period  Weeks    Status  Partially Met   limited by tightness with reaching behind back, 4/10 pain at worst with lifting light groceries   Target Date  06/23/19            Plan - 05/30/19 1203    Clinical Impression Statement  Patient reporting increased stiffness this AM- believes it is d/t increased lifting at home and riding on the motorcycle all day yesterday. Slightly decreased L shoulder PROM at beginning of session, thus allowed patient to work on [facility] for increased warm-up. Demonstrated limited ROM with supine and sidelying overhead lift, but able to pick back up with 2lbs after recent flare up. Patient reporting that she does not have access to dumbbells on days that she does not go to the gym, thus worked on supine flexion and sidelying abduction with light banded resistance to improve consistency with strengthening at home. Advised patient to perform within her limits as banded resistance observably more difficulty for her. Patient reported understanding and with no complaints at end of session.    Comorbidities  OA, lumbar DDD, uterine CA, hip surgery, back surgery    PT Frequency  2x / week    PT Duration  4 weeks    PT Treatment/Interventions  ADLs/Self Care Home Management;Cryotherapy;Electrical Stimulation;Moist Heat;Therapeutic exercise;Therapeutic activities;Functional mobility training;Ultrasound;Neuromuscular re-education;Manual techniques;Vasopneumatic Device;Taping;Splinting;Energy conservation;Dry needling;Passive range of motion;Scar mobilization    PT Next Visit Plan  improve L shoulder ROM and strength    Consulted and Agree with Plan of Care  Patient       Patient will benefit from skilled therapeutic intervention in order to improve the following deficits and impairments:  Hypomobility, Increased edema, Decreased scar  mobility, Decreased knowledge of precautions, Decreased activity tolerance, Decreased strength, Impaired UE functional use, Pain, Decreased range of motion, Improper body mechanics, Postural dysfunction, Impaired flexibility  Visit Diagnosis: Acute pain of left shoulder  Stiffness of left shoulder, not elsewhere classified  Muscle weakness (generalized)  Other symptoms and signs involving the musculoskeletal system     Problem List There are no active problems to display for this patient.    Janene Harvey, PT, DPT 05/30/19 12:07 PM   [facility] [address]  [place], [place], [address] Phone: [phone]   Fax:  [phone]  Name: Alyza Artiaga MRN: [id number] Date of Birth: 1960/09/30

## 2019-06-01 ENCOUNTER — Encounter: Payer: Self-pay | Admitting: Physical Therapy

## 2019-06-01 ENCOUNTER — Ambulatory Visit: Payer: Medicare Other | Admitting: Physical Therapy

## 2019-06-01 ENCOUNTER — Other Ambulatory Visit: Payer: Self-pay

## 2019-06-01 DIAGNOSIS — M25512 Pain in left shoulder: Secondary | ICD-10-CM

## 2019-06-01 DIAGNOSIS — M6281 Muscle weakness (generalized): Secondary | ICD-10-CM

## 2019-06-01 DIAGNOSIS — R29898 Other symptoms and signs involving the musculoskeletal system: Secondary | ICD-10-CM

## 2019-06-01 DIAGNOSIS — M25612 Stiffness of left shoulder, not elsewhere classified: Secondary | ICD-10-CM

## 2019-06-01 NOTE — Therapy (Addendum)
[facility] [address]  [place], [place], [address] Phone: [phone]   Fax:  [phone]  Physical Therapy Treatment  Patient Details  Name: Shannon Delacruz MRN: [id number] Date of Birth: 1961-09-04 Referring Provider (PT): Vonna Drafts, MD   Progress Note Reporting Period 05/30/19 to 06/01/19  See note below for Objective Data and Assessment of Progress/Goals.     Encounter Date: 06/01/2019  PT End of Session - 06/01/19 1200    Visit Number  22    Number of Visits  28    Date for PT Re-Evaluation  06/23/19    Authorization Type  [employer]HC Medicare & Medicaid    PT Start Time  1012    PT Stop Time  1058    PT Time Calculation (min)  46 min    Activity Tolerance  Patient tolerated treatment well;Patient limited by pain    Behavior During Therapy  [facility] for tasks assessed/performed       Past Medical History:  Diagnosis Date  . Cancer ([place])    UTERINE  . DDD (degenerative disc disease), lumbar   . Osteoarthritis     Past Surgical History:  Procedure Laterality Date  . ABDOMINAL HYSTERECTOMY    . APPENDECTOMY    . BACK SURGERY  2015  . HIP SURGERY      There were no vitals filed for this visit.  Subjective Assessment - 06/01/19 1013    Subjective  Used the UBE at [employer]. L shoulder is feeling stiff today,    Pertinent History  OA, lumbar DDD, uterine CA, hip surgery, back surgery    Patient Stated Goals  be able to put L arm behind back    Currently in Pain?  No/denies         [facility] PT Assessment - 06/01/19 0001      AROM   Left Shoulder Flexion  111 Degrees    Left Shoulder ABduction  76 Degrees    Left Shoulder Internal Rotation  --   FIR to top of L buttock   Left Shoulder External Rotation  --   FER C2     PROM   Left Shoulder Flexion  136 Degrees    Left Shoulder ABduction  97 Degrees   scaption for improved tolerance   Left Shoulder Internal Rotation  62 Degrees     Left Shoulder External Rotation  42 Degrees      Strength   Left Shoulder Flexion  3+/5    Left Shoulder ABduction  3+/5    Left Shoulder Internal Rotation  4/5    Left Shoulder External Rotation  4/5                   [facility] Adult PT Treatment/Exercise - 06/01/19 0001      Shoulder Exercises: Seated   Flexion  AROM;Left;10 reps    Flexion Limitations  still hiking L shoulder    Abduction  AROM;Left;10 reps    ABduction Limitations  still hiking L shoulder      Shoulder Exercises: Standing   External Rotation  Strengthening;Left;Theraband;15 reps    Theraband Level (Shoulder External Rotation)  Level 2 (Red)    External Rotation Limitations  cues to maintain elbow at side    Internal Rotation  Strengthening;Left;Theraband;15 reps    Theraband Level (Shoulder Internal Rotation)  Level 2 (Red)    Internal Rotation Limitations  cues to maintain elbow at side  Extension  Strengthening;Both;10 reps;Theraband    Theraband Level (Shoulder Extension)  Level 2 (Red)    Extension Limitations  straight arm pulldown with cues for scap squeeze    Row  Strengthening;Both;Theraband;15 reps    Theraband Level (Shoulder Row)  Level 3 (Green)    Row Limitations  good form      Shoulder Exercises: ROM/Strengthening   UBE (Upper Arm Bike)  L1.5 76mn forward/3 min back      Shoulder Exercises: Stretch   Other Shoulder Stretches  R shoulder gentle IR/ER stretch with strap to tolerance 5x5"      Manual Therapy   Manual Therapy  Passive ROM    Passive ROM  L shoulder PROM to tolerance in all directions with prolonged holds at end range and gentle distraction to improve comfort             PT Education - 06/01/19 1159    Education Details  update to HEP and edu on self-progression; edu on objective progress with PT    Person(s) Educated  Patient    Methods  Explanation;Demonstration;Tactile cues;Handout;Verbal cues    Comprehension  Verbalized understanding;Returned  demonstration       PT Short Term Goals - 05/26/19 1023      PT SHORT TERM GOAL #1   Title  Patient to be independent with initial HEP.    Time  3    Period  Weeks    Status  Achieved    Target Date  04/05/19        PT Long Term Goals - 06/01/19 1018      PT LONG TERM GOAL #1   Title  Patient to be independent with advanced HEP.    Time  4    Period  Weeks    Status  Partially Met   met for current     PT LONG TERM GOAL #2   Title  Patient to demonstrate L shoulder AROM/PROM WFL and without pain limiting.    Time  4    Period  Weeks    Status  Partially Met   improved in L shoulder flexion AROM and abduction PROM     PT LONG TERM GOAL #3   Title  Patient to demonstrate L shoulder strength >=4+/5.    Time  4    Period  Weeks    Status  Partially Met   improved in L shoulder IR and ER     PT LONG TERM GOAL #4   Title  Pt will be able to reach behind her back and to lift carry groceries with less than 1-2/10 pain.     Time  4    Period  Weeks    Status  Partially Met   2/10 pain with reaching behind back, no pain lifting light groceries           Plan - 06/01/19 1200    Clinical Impression Statement  Patient without new complaints today. Continuing to workout at the gym and perform HEP at home. Reporting improvements in her shoulder and believes that it will continue to improve with time. Would like to speak with MD at next F/U on 09/28 before continuing with PT. Measurements recently taken on 09/17, but patient still showing improvements since this appointment. Patient has demonstrated improved L shoulder flexion AROM and abduction PROM this session. Strength testing revealed improvements in L shoulder IR and ER strength. Patient reporting mild pain with reaching behind the back at this time  and with lifting heavy groceries. Worked on gentle IR and ER stretching to patient's tolerance. Added this exercise into HEP, with patient reporting understanding. Patient  performed periscapular strengthening with increase in banded resistance and reps, resulting in muscle fatigue but without pain. Patient has demonstrated steady improvement towards goals, but still with remaining impairments. Plan to  await MD recommendation before continuing with PT.    Comorbidities  OA, lumbar DDD, uterine CA, hip surgery, back surgery    PT Frequency  2x / week    PT Duration  4 weeks    PT Treatment/Interventions  ADLs/Self Care Home Management;Cryotherapy;Electrical Stimulation;Moist Heat;Therapeutic exercise;Therapeutic activities;Functional mobility training;Ultrasound;Neuromuscular re-education;Manual techniques;Vasopneumatic Device;Taping;Splinting;Energy conservation;Dry needling;Passive range of motion;Scar mobilization    PT Next Visit Plan  improve L shoulder ROM and strength    Consulted and Agree with Plan of Care  Patient       Patient will benefit from skilled therapeutic intervention in order to improve the following deficits and impairments:  Hypomobility, Increased edema, Decreased scar mobility, Decreased knowledge of precautions, Decreased activity tolerance, Decreased strength, Impaired UE functional use, Pain, Decreased range of motion, Improper body mechanics, Postural dysfunction, Impaired flexibility  Visit Diagnosis: Acute pain of left shoulder  Stiffness of left shoulder, not elsewhere classified  Muscle weakness (generalized)  Other symptoms and signs involving the musculoskeletal system     Problem List There are no active problems to display for this patient.    Janene Harvey, PT, DPT 06/01/19 12:03 PM   [facility] [address]  [place], [place], [address] Phone: [phone]   Fax:  [phone]  Name: Shannon Delacruz MRN: [id number] Date of Birth: 09-May-1961  PHYSICAL THERAPY DISCHARGE SUMMARY  Visits from Start of Care: 22  Current functional level related  to goals / functional outcomes: See above clinical impression; patient did not return after being placed on 30 day hold   Remaining deficits: Decreased ROM, strength, functional activity tolerance    Education / Equipment: HEP  Plan: Patient agrees to discharge.  Patient goals were partially met. Patient is being discharged due to being pleased with the current functional level.  ?????     Janene Harvey, PT, DPT 07/06/19 1:48 PM

## 2019-07-21 ENCOUNTER — Ambulatory Visit: Payer: Medicare Other | Admitting: Physical Therapy

## 2019-08-02 ENCOUNTER — Other Ambulatory Visit: Payer: Self-pay

## 2019-08-02 ENCOUNTER — Encounter: Payer: Self-pay | Admitting: Physical Therapy

## 2019-08-02 ENCOUNTER — Ambulatory Visit: Payer: Medicare Other | Attending: Orthopedic Surgery | Admitting: Physical Therapy

## 2019-08-02 DIAGNOSIS — R29898 Other symptoms and signs involving the musculoskeletal system: Secondary | ICD-10-CM | POA: Diagnosis present

## 2019-08-02 DIAGNOSIS — M25512 Pain in left shoulder: Secondary | ICD-10-CM | POA: Diagnosis present

## 2019-08-02 DIAGNOSIS — M25612 Stiffness of left shoulder, not elsewhere classified: Secondary | ICD-10-CM

## 2019-08-02 DIAGNOSIS — M6281 Muscle weakness (generalized): Secondary | ICD-10-CM | POA: Diagnosis present

## 2019-08-02 NOTE — Therapy (Signed)
[facility] [address]  [place], [place], [address] Phone: [phone]   Fax:  [phone]  Physical Therapy Evaluation  Patient Details  Name: Shannon Delacruz MRN: [id number] Date of Birth: 23-Apr-1961 Referring Provider (PT): Vonna Drafts, MD   Encounter Date: 08/02/2019  PT End of Session - 08/02/19 1200    Visit Number  1    Number of Visits  13    Date for PT Re-Evaluation  08/30/19    Authorization Type  UHC & Medicaid    PT Start Time  1017    PT Stop Time  1058    PT Time Calculation (min)  41 min    Activity Tolerance  Patient tolerated treatment well;Patient limited by pain    Behavior During Therapy  [facility] for tasks assessed/performed       Past Medical History:  Diagnosis Date  . Cancer ([place])    UTERINE  . DDD (degenerative disc disease), lumbar   . Osteoarthritis     Past Surgical History:  Procedure Laterality Date  . ABDOMINAL HYSTERECTOMY    . APPENDECTOMY    . BACK SURGERY  2015  . HIP SURGERY      There were no vitals filed for this visit.   Subjective Assessment - 08/02/19 1019    Subjective  Patient reporting that the surgical L shoulder pain that she experienced after her RTC repair and biceps tenodesis has resolved, but feels that her mobility is not where is needs to be. Having most trouble reaching behind her back, overhead, out to the side. Feels that she has a little bit of weakness in the L shoulder remaining. Stopped doing her weighted exercises at the gym d/t COVID. Noting that her surgeon advised that therapy should be "aggressive." Would like to return to being able to reach her coffee cup up into the microwave without difficulty.    Pertinent History  OA, lumbar DDD, uterine CA, hip surgery, back surgery    Limitations  Lifting;House hold activities    Diagnostic tests  none recent    Patient Stated Goals  work on mobility    Currently in Pain?  No/denies    Pain Score   0-No pain    Pain Location  Shoulder    Pain Orientation  Left    Pain Descriptors / Indicators  Aching    Pain Type  Chronic pain         [facility] PT Assessment - 08/02/19 1026      Assessment   Medical Diagnosis  Unspecified RTC tear or rupture of L shoulder, not specified as traumatic    Referring Provider (PT)  Vonna Drafts, MD    Onset Date/Surgical Date  03/03/19    Hand Dominance  Right    Next MD Visit  09/11/18    Prior Therapy  yes- L shoulder      Precautions   Precautions  --   hx uterine CA     Balance Screen   Has the patient fallen in the past 6 months  No    Has the patient had a decrease in activity level because of a fear of falling?   No    Is the patient reluctant to leave their home because of a fear of falling?   No      Home Environment   Living Environment  Private residence    Living Arrangements  Alone    Available Help at Discharge  Friend(s)    Type of Home  House      Prior Function   Level of Independence  Independent    Vocation  On disability    Leisure  exercising at the gym      Cognition   Overall Cognitive Status  Within Functional Limits for tasks assessed      Observation/Other Assessments   Focus on Therapeutic Outcomes (FOTO)   Shoulder: 53 (47% limited, 37% predicted)      Sensation   Light Touch  Appears Intact      Coordination   Gross Motor Movements are Fluid and Coordinated  Yes      Posture/Postural Control   Posture/Postural Control  Postural limitations    Postural Limitations  Rounded Shoulders      ROM / Strength   AROM / PROM / Strength  AROM;Strength      AROM   AROM Assessment Site  Shoulder    Right/Left Shoulder  Right;Left    Right Shoulder Flexion  144 Degrees   mild pain   Right Shoulder ABduction  153 Degrees   mild pain   Right Shoulder Internal Rotation  --   FIR T8   Right Shoulder External Rotation  --   FER T3   Left Shoulder Flexion  93 Degrees   "tightness"   Left Shoulder ABduction   75 Degrees   "tightness"   Left Shoulder Internal Rotation  --   FIR just lateral to iliac crest ~L1/2; pain   Left Shoulder External Rotation  --   FER to touch top/back of head; pain     Strength   Strength Assessment Site  Shoulder    Right/Left Shoulder  Right;Left    Right Shoulder Flexion  4+/5    Right Shoulder ABduction  4+/5    Right Shoulder Internal Rotation  4+/5    Right Shoulder External Rotation  4+/5    Left Shoulder Flexion  4+/5    Left Shoulder ABduction  4/5    Left Shoulder Internal Rotation  4/5   pain   Left Shoulder External Rotation  4/5      Palpation   Palpation comment  TTP and soft tissue restriction in L UT and infraspinatus/teres                Objective measurements completed on examination: See above findings.              PT Education - 08/02/19 1156    Education Details  prognosis, POC, HEP    Person(s) Educated  Patient    Methods  Explanation;Demonstration;Tactile cues;Verbal cues;Handout    Comprehension  Verbalized understanding;Returned demonstration       PT Short Term Goals - 08/02/19 1207      PT SHORT TERM GOAL #1   Title  Patient to be independent with initial HEP.    Time  2    Period  Weeks    Status  New    Target Date  08/16/19        PT Long Term Goals - 08/02/19 1208      PT LONG TERM GOAL #1   Title  Patient to be independent with advanced HEP.    Time  4    Period  Weeks    Status  New    Target Date  08/30/19      PT LONG TERM GOAL #2   Title  Patient to demonstrate L shoulder AROM symmetrical to opposite  UE and without pain limiting.    Time  4    Period  Weeks    Status  New    Target Date  08/30/19      PT LONG TERM GOAL #3   Title  Patient to demonstrate L shoulder strength >=4+/5.    Time  4    Period  Weeks    Status  New    Target Date  08/30/19      PT LONG TERM GOAL #4   Title  Patient to report 75% improvement in ability to reach behind her back.    Time  4     Period  Weeks    Status  New    Target Date  08/30/19      PT LONG TERM GOAL #5   Title  Patient to demonstrate lifting 2lb weight to overhead shelf with L UE with good mechanics in order to improve ability to place mug up into microwave.    Time  4    Period  Weeks    Status  New    Target Date  08/30/19             Plan - 08/02/19 1202    Clinical Impression Statement  Patient is a 58y/o F presenting to OPPT with c/o L shoulder pain s/p L RTC repair and biceps tenodesis on 03/03/19. Notes that pain levels have mostly subsided but having remaining trouble with ROM behind the back, reaching overhead, and into abduction. Also notes weakness, such as when reaching her coffee cup up into the microwave. Patient today demonstrated decreased L shoulder AROM, decreased L shoulder strength, and tenderness and soft tissue restriction over L UT and infraspinatus/teres group. Patient educated on gentle AROM and strengthening HEP and reported understanding. Would benefit from skilled PT services 3x/week for 4 weeks to address aforementioned impairments.    Personal Factors and Comorbidities  Age;Sex;Comorbidity 3+;Time since onset of injury/illness/exacerbation;Fitness;Past/Current Experience    Comorbidities  OA, lumbar DDD, uterine CA, hip surgery, back surgery    Examination-Activity Limitations  Carry;Dressing;Hygiene/Grooming;Lift;Reach Overhead    Examination-Participation Restrictions  Cleaning;Shop;Community Activity;Interpersonal Relationship;Laundry;Meal Prep    Stability/Clinical Decision Making  Stable/Uncomplicated    Clinical Decision Making  Low    Rehab Potential  Good    PT Frequency  3x / week    PT Duration  4 weeks    PT Treatment/Interventions  ADLs/Self Care Home Management;Cryotherapy;Electrical Stimulation;Moist Heat;Therapeutic exercise;Therapeutic activities;Functional mobility training;Ultrasound;Neuromuscular re-education;Patient/family education;Manual  techniques;Vasopneumatic Device;Taping;Energy conservation;Dry needling;Passive range of motion;Scar mobilization    PT Next Visit Plan  reassess HEP    Consulted and Agree with Plan of Care  Patient       Patient will benefit from skilled therapeutic intervention in order to improve the following deficits and impairments:  Hypomobility, Decreased activity tolerance, Decreased strength, Increased fascial restricitons, Impaired UE functional use, Pain, Improper body mechanics, Decreased range of motion, Impaired flexibility, Postural dysfunction  Visit Diagnosis: Acute pain of left shoulder  Stiffness of left shoulder, not elsewhere classified  Muscle weakness (generalized)  Other symptoms and signs involving the musculoskeletal system     Problem List There are no active problems to display for this patient.    Janene Harvey, PT, DPT 08/02/19 12:10 PM   [facility] [address]  [place], [place], [address] Phone: [phone]   Fax:  [phone]  Name: Shannon Delacruz MRN: [id number] Date of Birth: 02-18-1961

## 2019-08-03 ENCOUNTER — Ambulatory Visit: Payer: Medicare Other

## 2019-08-03 DIAGNOSIS — M25612 Stiffness of left shoulder, not elsewhere classified: Secondary | ICD-10-CM

## 2019-08-03 DIAGNOSIS — M6281 Muscle weakness (generalized): Secondary | ICD-10-CM

## 2019-08-03 DIAGNOSIS — M25512 Pain in left shoulder: Secondary | ICD-10-CM

## 2019-08-03 DIAGNOSIS — R29898 Other symptoms and signs involving the musculoskeletal system: Secondary | ICD-10-CM

## 2019-08-03 NOTE — Therapy (Signed)
[facility] [address]  [place], [place], [address] Phone: [phone]   Fax:  [phone]  Physical Therapy Treatment  Patient Details  Name: Shannon Delacruz MRN: [id number] Date of Birth: 26-Nov-1960 Referring Provider (PT): Vonna Drafts, MD   Encounter Date: 08/03/2019  PT End of Session - 08/03/19 0936    Visit Number  2    Number of Visits  13    Date for PT Re-Evaluation  08/30/19    Authorization Type  UHC & Medicare    PT Start Time  0929    PT Stop Time  1027   Ended visit with 10 min moist heat to shoulder   PT Time Calculation (min)  58 min    Activity Tolerance  Patient tolerated treatment well;Patient limited by pain    Behavior During Therapy  [facility] for tasks assessed/performed       Past Medical History:  Diagnosis Date  . Cancer ([place])    UTERINE  . DDD (degenerative disc disease), lumbar   . Osteoarthritis     Past Surgical History:  Procedure Laterality Date  . ABDOMINAL HYSTERECTOMY    . APPENDECTOMY    . BACK SURGERY  2015  . HIP SURGERY      There were no vitals filed for this visit.  Subjective Assessment - 08/03/19 0935    Subjective  Pt. reporting she is having difficulty reaching behind back.    Pertinent History  OA, lumbar DDD, uterine CA, hip surgery, back surgery    Diagnostic tests  none recent    Patient Stated Goals  work on mobility    Currently in Pain?  No/denies    Pain Score  0-No pain    Pain Location  Shoulder    Pain Orientation  Left    Pain Descriptors / Indicators  Aching    Pain Type  Chronic pain    Multiple Pain Sites  No                       [facility] Adult PT Treatment/Exercise - 08/03/19 0001      Self-Care   Self-Care  Other Self-Care Comments    Other Self-Care Comments   review of self-ball ball massage to posterior/inferior shoulder and rhomboids on wall       Shoulder Exercises: Supine   Flexion  Left;AROM;10 reps    Flexion Limitations  5" hold       Shoulder Exercises: Standing   Internal Rotation  Left;10 reps;Strengthening;Theraband    Theraband Level (Shoulder Internal Rotation)  Level 2 (Red)    Internal Rotation Limitations  minor cueing required for spacing       Shoulder Exercises: Pulleys   Flexion  3 minutes    Flexion Limitations  cues to push into discomfort as necessary     Scaption  3 minutes    Scaption Limitations  cues for stretch at top      Shoulder Exercises: Stretch   Internal Rotation Stretch Limitations  5" x 10 reps    strap behind back   Other Shoulder Stretches  B shoulder extension 5" x 10 resp     Other Shoulder Stretches  L sidelying sleeper stretch 2 x 30 sec       Modalities   Modalities  Moist Heat      Moist Heat Therapy   Number Minutes Moist Heat  10 Minutes    Moist Heat Location  Shoulder   L anterior and posterior shoulder      Manual Therapy   Manual Therapy  Soft tissue mobilization;Myofascial release;Passive ROM    Manual therapy comments  supine and sidelying     Soft tissue mobilization  SMT to L proximal biceps, pecs, posterior/inferior shoulder     Myofascial Release  TPR to posterior/inferiorr shoulder     Passive ROM  Manual L pec stretch in varying dir with therapist x 30 sec each              PT Education - 08/03/19 1037    Education Details  review and HEP update with self-ball massage to posterior/inferior shoulder on wall    Person(s) Educated  Patient    Methods  Explanation;Demonstration;Verbal cues;Handout    Comprehension  Verbalized understanding;Returned demonstration;Verbal cues required       PT Short Term Goals - 08/03/19 0936      PT SHORT TERM GOAL #1   Title  Patient to be independent with initial HEP.    Time  2    Period  Weeks    Status  On-going    Target Date  08/16/19        PT Long Term Goals - 08/03/19 0936      PT LONG TERM GOAL #1   Title  Patient to be independent with advanced HEP.     Time  4    Period  Weeks    Status  On-going      PT LONG TERM GOAL #2   Title  Patient to demonstrate L shoulder AROM symmetrical to opposite UE and without pain limiting.    Time  4    Period  Weeks    Status  On-going      PT LONG TERM GOAL #3   Title  Patient to demonstrate L shoulder strength >=4+/5.    Time  4    Period  Weeks    Status  On-going      PT LONG TERM GOAL #4   Title  Patient to report 75% improvement in ability to reach behind her back.    Time  4    Period  Weeks    Status  On-going      PT LONG TERM GOAL #5   Title  Patient to demonstrate lifting 2lb weight to overhead shelf with L UE with good mechanics in order to improve ability to place mug up into microwave.    Time  4    Period  Weeks    Status  On-going            Plan - 08/03/19 0942    Clinical Impression Statement  Shannon Delacruz reporting L shoulder "stiffness" and pain with reaching behind back.  Session focused on ROM activities/MT for improved IR, extension to improve behind back motion and reviewed HEP to check for proper technique.  Minor cueing required for slow pacing and positioning with HEP however pt. tolerated ROM therex well today.  Instructed pt. in self-anterior shoulder mobs using chair back today in seated position.  Pt. with L shoulder tenderness/tightness in posterior/inferior shoulder and anterior/lateral shoulder today which was addressed with MT and moist heat to end session.  Pt. leaving session pain free and encouraged to perform HEP daily for full benefit from therapy.    Comorbidities  OA, lumbar DDD, uterine CA, hip surgery, back surgery    Rehab Potential  Good    PT Treatment/Interventions  ADLs/Self Care  Home Management;Cryotherapy;Electrical Stimulation;Moist Heat;Therapeutic exercise;Therapeutic activities;Functional mobility training;Ultrasound;Neuromuscular re-education;Patient/family education;Manual techniques;Vasopneumatic Device;Taping;Energy conservation;Dry  needling;Passive range of motion;Scar mobilization    PT Next Visit Plan  Monitor response to progression of ROM activities; monitor tolerance for self-STM with ball on wall to posterior shoulder    Consulted and Agree with Plan of Care  Patient       Patient will benefit from skilled therapeutic intervention in order to improve the following deficits and impairments:  Hypomobility, Decreased activity tolerance, Decreased strength, Increased fascial restricitons, Impaired UE functional use, Pain, Improper body mechanics, Decreased range of motion, Impaired flexibility, Postural dysfunction  Visit Diagnosis: Acute pain of left shoulder  Stiffness of left shoulder, not elsewhere classified  Muscle weakness (generalized)  Other symptoms and signs involving the musculoskeletal system     Problem List There are no active problems to display for this patient.   Bess Harvest, PTA 08/03/19 12:06 PM   [facility] [address]  [place], [place], [address] Phone: [phone]   Fax:  [phone]  Name: Shannon Delacruz MRN: [id number] Date of Birth: 02/28/1961

## 2019-08-08 ENCOUNTER — Other Ambulatory Visit: Payer: Self-pay

## 2019-08-08 ENCOUNTER — Ambulatory Visit: Payer: Medicare Other

## 2019-08-08 DIAGNOSIS — M25512 Pain in left shoulder: Secondary | ICD-10-CM

## 2019-08-08 DIAGNOSIS — M6281 Muscle weakness (generalized): Secondary | ICD-10-CM

## 2019-08-08 DIAGNOSIS — R29898 Other symptoms and signs involving the musculoskeletal system: Secondary | ICD-10-CM

## 2019-08-08 DIAGNOSIS — M25612 Stiffness of left shoulder, not elsewhere classified: Secondary | ICD-10-CM

## 2019-08-08 NOTE — Therapy (Signed)
[facility] [address]  [place], [place], [address] Phone: [phone]   Fax:  [phone]  Physical Therapy Treatment  Patient Details  Name: Shannon Delacruz MRN: [id number] Date of Birth: [birth year] Referring Provider (PT): Vonna Drafts, MD   Encounter Date: 08/08/2019  PT End of Session - 08/08/19 0940    Visit Number  3    Number of Visits  13    Date for PT Re-Evaluation  08/30/19    Authorization Type  UHC & Medicare    PT Start Time  [phone]    PT Stop Time  1025   Ended visit with 10 min moist heat to L shouldre   PT Time Calculation (min)  49 min    Activity Tolerance  Patient tolerated treatment well;Patient limited by pain    Behavior During Therapy  [facility] for tasks assessed/performed       Past Medical History:  Diagnosis Date  . Cancer ([place])    UTERINE  . DDD (degenerative disc disease), lumbar   . Osteoarthritis     Past Surgical History:  Procedure Laterality Date  . ABDOMINAL HYSTERECTOMY    . APPENDECTOMY    . BACK SURGERY  2015  . HIP SURGERY      There were no vitals filed for this visit.  Subjective Assessment - 08/08/19 0940    Subjective  Pt. noting some pain which she attributes to weather change.    Pertinent History  OA, lumbar DDD, uterine CA, hip surgery, back surgery    Diagnostic tests  none recent    Patient Stated Goals  work on mobility    Currently in Pain?  Yes    Pain Score  6     Pain Location  Shoulder    Pain Orientation  Left    Pain Descriptors / Indicators  Aching    Pain Type  Chronic pain    Multiple Pain Sites  No                       [facility] Adult PT Treatment/Exercise - 08/08/19 0001      Self-Care   Self-Care  Other Self-Care Comments    Other Self-Care Comments   review of self-ball ball massage to posterior/inferior shoulder and rhomboids on wall       Shoulder Exercises: Pulleys   Flexion  3 minutes    Flexion Limitations   cues to be aggressive     Scaption  3 minutes    Scaption Limitations  cues to be aggressive       Shoulder Exercises: IT sales professional  2 reps;30 seconds    Corner Stretch Limitations  low on doorway     Internal Rotation Stretch Limitations  5" x 10 reps     Other Shoulder Stretches  B shoulder extension 5" x 10 resp     Other Shoulder Stretches  L sidelying sleeper stretch 2 x 30 sec       Moist Heat Therapy   Number Minutes Moist Heat  10 Minutes    Moist Heat Location  Shoulder      Manual Therapy   Manual Therapy  Passive ROM    Manual therapy comments  supine and sidelying     Soft tissue mobilization  SMT to L proximal biceps, pecs, posterior/inferior shoulder     Myofascial Release  TPR to posterior/inferiorr shoulder     Passive ROM  Manual gentle L shoulder IR, ER stretch with therapist x 30 sec; Manual L pec stretch in varying dir with therapist x 30 sec each              PT Education - 08/08/19 1111    Education Details  HEP update;  sleep stretch for IR, posterior shoulder stretch    Person(s) Educated  Patient    Methods  Explanation;Demonstration;Verbal cues;Handout    Comprehension  Verbalized understanding;Returned demonstration;Verbal cues required       PT Short Term Goals - 08/03/19 0936      PT SHORT TERM GOAL #1   Title  Patient to be independent with initial HEP.    Time  2    Period  Weeks    Status  On-going    Target Date  08/16/19        PT Long Term Goals - 08/03/19 0936      PT LONG TERM GOAL #1   Title  Patient to be independent with advanced HEP.    Time  4    Period  Weeks    Status  On-going      PT LONG TERM GOAL #2   Title  Patient to demonstrate L shoulder AROM symmetrical to opposite UE and without pain limiting.    Time  4    Period  Weeks    Status  On-going      PT LONG TERM GOAL #3   Title  Patient to demonstrate L shoulder strength >=4+/5.    Time  4    Period  Weeks    Status  On-going      PT  LONG TERM GOAL #4   Title  Patient to report 75% improvement in ability to reach behind her back.    Time  4    Period  Weeks    Status  On-going      PT LONG TERM GOAL #5   Title  Patient to demonstrate lifting 2lb weight to overhead shelf with L UE with good mechanics in order to improve ability to place mug up into microwave.    Time  4    Period  Weeks    Status  On-going            Plan - 08/08/19 0954    Clinical Impression Statement  Jeralene tolerated session today well focused on improving L shoulder ROM and education on self-massage. Pt. admits to partial adherence to HEP over weekend and did require cueing for proper positioning with self-ball release to posterior shoulder and cueing at times to increased intensity of ROM activities for appropriate stretch.  Updated HEP with additional L shoulder stretches which pt. was able to perform well with in session.  MT focused on posterior/inferior shoulder STM in area of tightness/tenderness at L distal Infra, Teres group.  Ended session with 10 min moist heat to L shoulder per pt. request as she felt benefit from this last session.    Personal Factors and Comorbidities  Comorbidity 1    Comorbidities  OA, lumbar DDD, uterine CA, hip surgery, back surgery    Rehab Potential  Good    PT Treatment/Interventions  ADLs/Self Care Home Management;Cryotherapy;Electrical Stimulation;Moist Heat;Therapeutic exercise;Therapeutic activities;Functional mobility training;Ultrasound;Neuromuscular re-education;Patient/family education;Manual techniques;Vasopneumatic Device;Taping;Energy conservation;Dry needling;Passive range of motion;Scar mobilization    PT Next Visit Plan  Monitor response to progression of ROM activities; monitor tolerance for self-STM with ball on wall to posterior shoulder    Consulted and  Agree with Plan of Care  Patient       Patient will benefit from skilled therapeutic intervention in order to improve the following deficits  and impairments:  Hypomobility, Decreased activity tolerance, Decreased strength, Increased fascial restricitons, Impaired UE functional use, Pain, Improper body mechanics, Decreased range of motion, Impaired flexibility, Postural dysfunction  Visit Diagnosis: Acute pain of left shoulder  Stiffness of left shoulder, not elsewhere classified  Muscle weakness (generalized)  Other symptoms and signs involving the musculoskeletal system     Problem List There are no active problems to display for this patient.   Bess Harvest, PTA 08/08/19 12:53 PM    [facility] [address]  [place], [place], [address] Phone: [phone]   Fax:  [phone]  Name: Shannon Delacruz MRN: [id number] Date of Birth: July 11, 1961

## 2019-08-10 ENCOUNTER — Ambulatory Visit: Payer: Medicare Other | Attending: Orthopedic Surgery

## 2019-08-10 ENCOUNTER — Other Ambulatory Visit: Payer: Self-pay

## 2019-08-10 DIAGNOSIS — R29898 Other symptoms and signs involving the musculoskeletal system: Secondary | ICD-10-CM | POA: Diagnosis present

## 2019-08-10 DIAGNOSIS — M25512 Pain in left shoulder: Secondary | ICD-10-CM | POA: Insufficient documentation

## 2019-08-10 DIAGNOSIS — M25612 Stiffness of left shoulder, not elsewhere classified: Secondary | ICD-10-CM

## 2019-08-10 DIAGNOSIS — M6281 Muscle weakness (generalized): Secondary | ICD-10-CM | POA: Insufficient documentation

## 2019-08-10 NOTE — Therapy (Signed)
[facility] [address]  [place], [place], [address] Phone: [phone]   Fax:  [phone]  Physical Therapy Treatment  Patient Details  Name: Shannon Delacruz MRN: [id number] Date of Birth: Jun 12, 1961 Referring Provider (PT): Vonna Drafts, MD   Encounter Date: 08/10/2019  PT End of Session - 08/10/19 0936    Visit Number  4    Number of Visits  13    Date for PT Re-Evaluation  08/30/19    Authorization Type  UHC & Medicare    PT Start Time  0930    PT Stop Time  1015    PT Time Calculation (min)  45 min    Activity Tolerance  Patient tolerated treatment well;Patient limited by pain    Behavior During Therapy  [facility] for tasks assessed/performed       Past Medical History:  Diagnosis Date  . Cancer ([place])    UTERINE  . DDD (degenerative disc disease), lumbar   . Osteoarthritis     Past Surgical History:  Procedure Laterality Date  . ABDOMINAL HYSTERECTOMY    . APPENDECTOMY    . BACK SURGERY  2015  . HIP SURGERY      There were no vitals filed for this visit.  Subjective Assessment - 08/10/19 0933    Subjective  Pt. noting some L shoulder pain with lifting over last few days.  feels some increased soreness after "being more aggressive" with ROM.    Pertinent History  OA, lumbar DDD, uterine CA, hip surgery, back surgery    Diagnostic tests  none recent    Patient Stated Goals  work on mobility    Currently in Pain?  Yes    Pain Score  4    up to 4/10 at worst with lifting and pulleys overhead   Pain Location  Shoulder    Pain Orientation  Left    Pain Descriptors / Indicators  Aching;Sharp    Pain Type  Chronic pain    Pain Frequency  Intermittent    Aggravating Factors   reaching, lifting, pulleys overhead    Multiple Pain Sites  No                       [facility] Adult PT Treatment/Exercise - 08/10/19 0001      Shoulder Exercises: Supine   External Rotation  Left;AAROM;10 reps    External Rotation Limitations  wand    Flexion  Left;AROM;15 reps    Shoulder Flexion Weight (lbs)  2    Flexion Limitations  wand; 5" hold     ABduction  Left;10 reps;AAROM    ABduction Limitations  wand scaption       Shoulder Exercises: Pulleys   Flexion  3 minutes    Flexion Limitations  cues to be aggressive     Scaption  3 minutes    Scaption Limitations  cues to be aggressive       Shoulder Exercises: IT sales professional  2 reps;30 seconds    Corner Stretch Limitations  low on doorway     Internal Rotation Stretch Limitations  5" x 10 reps     Other Shoulder Stretches  L lats stretch 2 x 30 sec with setaed green p-ball rollouts       Manual Therapy   Manual Therapy  Passive ROM;Soft tissue mobilization    Soft tissue mobilization  STM to L posterior/inferior shoulder  Passive ROM  L shoulder PROM all directions with contract/relax stretching x 3 rounds into all motions;  MWM into flexion, abduction strethes for improved scapular motion;  L manual pec stretch with therapist x 30 sec in varied positions              PT Education - 08/10/19 1035    Education Details  HEP update;  L lat stretch p-ball rollouts in seated position    Person(s) Educated  Patient    Methods  Explanation;Demonstration;Verbal cues;Handout    Comprehension  Verbalized understanding;Returned demonstration;Verbal cues required       PT Short Term Goals - 08/10/19 1227      PT SHORT TERM GOAL #1   Title  Patient to be independent with initial HEP.    Time  2    Period  Weeks    Status  Achieved    Target Date  08/16/19        PT Long Term Goals - 08/03/19 0936      PT LONG TERM GOAL #1   Title  Patient to be independent with advanced HEP.    Time  4    Period  Weeks    Status  On-going      PT LONG TERM GOAL #2   Title  Patient to demonstrate L shoulder AROM symmetrical to opposite UE and without pain limiting.    Time  4    Period  Weeks    Status  On-going      PT  LONG TERM GOAL #3   Title  Patient to demonstrate L shoulder strength >=4+/5.    Time  4    Period  Weeks    Status  On-going      PT LONG TERM GOAL #4   Title  Patient to report 75% improvement in ability to reach behind her back.    Time  4    Period  Weeks    Status  On-going      PT LONG TERM GOAL #5   Title  Patient to demonstrate lifting 2lb weight to overhead shelf with L UE with good mechanics in order to improve ability to place mug up into microwave.    Time  4    Period  Weeks    Status  On-going            Plan - 08/10/19 0942    Clinical Impression Statement  Shannon Delacruz admits to partial adherence to updated HEP.  Pt. encouraged to perform full HEP for full benefit from therapy.  Does feel that she is getting more shoulder soreness over this past week which she attributes to being more "aggressive" with ROM activities at home and in therapy.  Session focused on MT and ROM therex with aggressive approach to improved L shoulder ROM.  Initiated contract/relax stretching into all motions of L shoulder and MWM as pt. tolerated.  Pt. somewhat limited at times by moderate report of pain at end ranges of ER, flexion, scaption/abd, however pushing somewhat more aggressive with these motions per pt. report of MD instructions.  Ended visit with pt. pain free at rest and encouraged to use moist heat pack at home as needed to promote muscular relaxation as she still tends to have muscular guarding throughout therex.    Comorbidities  OA, lumbar DDD, uterine CA, hip surgery, back surgery    Rehab Potential  Good    PT Treatment/Interventions  ADLs/Self Care Home Management;Cryotherapy;Electrical Stimulation;Moist Heat;Therapeutic  exercise;Therapeutic activities;Functional mobility training;Ultrasound;Neuromuscular re-education;Patient/family education;Manual techniques;Vasopneumatic Device;Taping;Energy conservation;Dry needling;Passive range of motion;Scar mobilization    PT Next Visit Plan   Monitor response to progression of ROM activities; monitor tolerance for self-STM with ball on wall to posterior shoulder    Consulted and Agree with Plan of Care  Patient       Patient will benefit from skilled therapeutic intervention in order to improve the following deficits and impairments:  Hypomobility, Decreased activity tolerance, Decreased strength, Increased fascial restricitons, Impaired UE functional use, Pain, Improper body mechanics, Decreased range of motion, Impaired flexibility, Postural dysfunction  Visit Diagnosis: Acute pain of left shoulder  Stiffness of left shoulder, not elsewhere classified  Muscle weakness (generalized)  Other symptoms and signs involving the musculoskeletal system     Problem List There are no active problems to display for this patient.   Bess Harvest, PTA 08/10/19 12:38 PM   [facility] [address]  [place], [place], [address] Phone: [phone]   Fax:  [phone]  Name: Shannon Delacruz MRN: [id number] Date of Birth: 10/23/1960

## 2019-08-11 ENCOUNTER — Ambulatory Visit: Payer: Medicare Other

## 2019-08-11 DIAGNOSIS — M6281 Muscle weakness (generalized): Secondary | ICD-10-CM

## 2019-08-11 DIAGNOSIS — R29898 Other symptoms and signs involving the musculoskeletal system: Secondary | ICD-10-CM

## 2019-08-11 DIAGNOSIS — M25512 Pain in left shoulder: Secondary | ICD-10-CM

## 2019-08-11 DIAGNOSIS — M25612 Stiffness of left shoulder, not elsewhere classified: Secondary | ICD-10-CM

## 2019-08-11 NOTE — Therapy (Signed)
[facility] [address]  [place], [place], [address] Phone: [phone]   Fax:  [phone]  Physical Therapy Treatment  Patient Details  Name: Shannon Delacruz MRN: [id number] Date of Birth: Apr 29, 1961 Referring Provider (PT): Vonna Drafts, MD   Encounter Date: 08/11/2019  PT End of Session - 08/11/19 0939    Visit Number  5    Number of Visits  13    Date for PT Re-Evaluation  08/30/19    Authorization Type  UHC & Medicare    PT Start Time  0930    PT Stop Time  1030   moist heat 15 min   PT Time Calculation (min)  60 min    Activity Tolerance  Patient tolerated treatment well;Patient limited by pain    Behavior During Therapy  [facility] for tasks assessed/performed       Past Medical History:  Diagnosis Date  . Cancer ([place])    UTERINE  . DDD (degenerative disc disease), lumbar   . Osteoarthritis     Past Surgical History:  Procedure Laterality Date  . ABDOMINAL HYSTERECTOMY    . APPENDECTOMY    . BACK SURGERY  2015  . HIP SURGERY      There were no vitals filed for this visit.  Subjective Assessment - 08/11/19 0936    Subjective  Pt. noting her shoulder soreness increased with exercises last night.    Pertinent History  OA, lumbar DDD, uterine CA, hip surgery, back surgery    Diagnostic tests  none recent    Patient Stated Goals  work on mobility    Currently in Pain?  Yes    Pain Score  4    up to 8/10 at worst   Pain Location  Shoulder    Pain Orientation  Left    Pain Descriptors / Indicators  Aching;Sharp    Pain Type  Chronic pain    Pain Frequency  Intermittent    Multiple Pain Sites  No         [facility] PT Assessment - 08/11/19 0001      AROM   Left Shoulder Flexion  108 Degrees    Left Shoulder ABduction  97 Degrees    Left Shoulder Internal Rotation  --   FIR to beltline   Left Shoulder External Rotation  --   FER to back of head                  [facility] Adult PT  Treatment/Exercise - 08/11/19 0001      Shoulder Exercises: Pulleys   Flexion  2 minutes    Flexion Limitations  cues to be aggressive     Scaption  2 minutes    Scaption Limitations  cues to be aggressive       Shoulder Exercises: ROM/Strengthening   UBE (Upper Arm Bike)  Lvl 1.0, 3 min forwards/ 3 min backwards       Shoulder Exercises: Stretch   Corner Stretch  2 reps;30 seconds    Corner Stretch Limitations  low, mid on doorway     Cross Chest Stretch  2 reps;30 seconds    Cross Chest Stretch Limitations  on door seal     Other Shoulder Stretches  L lats stretch 2 x 30 sec with setaed green p-ball rollouts       Moist Heat Therapy   Number Minutes Moist Heat  15 Minutes    Moist Heat Location  Shoulder  L     Manual Therapy   Manual Therapy  Passive ROM;Soft tissue mobilization    Manual therapy comments  supine and sidelying     Soft tissue mobilization  STM/DTM to L pec, L subscap.    Myofascial Release  TPR to L pec, L subscap and anterior deltoid/biceps    Passive ROM  L shoulder PROM all directions with contract/relax stretching x 3 rounds into all motions;  MWM into flexion, abduction strethes for improved scapular motion;  L manual pec stretch with therapist x 30 sec in varied positions                PT Short Term Goals - 08/10/19 1227      PT SHORT TERM GOAL #1   Title  Patient to be independent with initial HEP.    Time  2    Period  Weeks    Status  Achieved    Target Date  08/16/19        PT Long Term Goals - 08/03/19 0936      PT LONG TERM GOAL #1   Title  Patient to be independent with advanced HEP.    Time  4    Period  Weeks    Status  On-going      PT LONG TERM GOAL #2   Title  Patient to demonstrate L shoulder AROM symmetrical to opposite UE and without pain limiting.    Time  4    Period  Weeks    Status  On-going      PT LONG TERM GOAL #3   Title  Patient to demonstrate L shoulder strength >=4+/5.    Time  4    Period   Weeks    Status  On-going      PT LONG TERM GOAL #4   Title  Patient to report 75% improvement in ability to reach behind her back.    Time  4    Period  Weeks    Status  On-going      PT LONG TERM GOAL #5   Title  Patient to demonstrate lifting 2lb weight to overhead shelf with L UE with good mechanics in order to improve ability to place mug up into microwave.    Time  4    Period  Weeks    Status  On-going            Plan - 08/11/19 0943    Clinical Impression Statement  Azayla admitting to continued partial adherence to HEP.  Strongly encouraged to become consistent with ROM/strengthening HEP for full benefit from therapy.  Pt. verbalized understanding.  Tolerated aggressive MT for improved tissue quality in L pecs, subscap, anterior deltoid well today.  Continued PROM + manual contract/relax PNF stretching into all L shoulder motions today for improved shoulder ROM.  Ended visit with moist heat to L posterior/anterior shoulder per pt. request for relaxation of musculature.  Pt. L shoulder AROM improved 10-20 dg into flexion/abduction, slight improvement into FER, FIR today (see flowsheet).    Comorbidities  OA, lumbar DDD, uterine CA, hip surgery, back surgery    Rehab Potential  Good    PT Treatment/Interventions  ADLs/Self Care Home Management;Cryotherapy;Electrical Stimulation;Moist Heat;Therapeutic exercise;Therapeutic activities;Functional mobility training;Ultrasound;Neuromuscular re-education;Patient/family education;Manual techniques;Vasopneumatic Device;Taping;Energy conservation;Dry needling;Passive range of motion;Scar mobilization    PT Next Visit Plan  Monitor response to progression of ROM activities; monitor tolerance for self-STM with ball on wall to posterior shoulder  Consulted and Agree with Plan of Care  Patient       Patient will benefit from skilled therapeutic intervention in order to improve the following deficits and impairments:  Hypomobility, Decreased  activity tolerance, Decreased strength, Increased fascial restricitons, Impaired UE functional use, Pain, Improper body mechanics, Decreased range of motion, Impaired flexibility, Postural dysfunction  Visit Diagnosis: Acute pain of left shoulder  Stiffness of left shoulder, not elsewhere classified  Muscle weakness (generalized)  Other symptoms and signs involving the musculoskeletal system     Problem List There are no active problems to display for this patient.   Bess Harvest, PTA 08/11/19 12:01 PM   [facility] [address]  [place], [place], [address] Phone: [phone]   Fax:  [phone]  Name: Shannon Delacruz MRN: [id number] Date of Birth: 1960-12-29

## 2019-08-15 ENCOUNTER — Other Ambulatory Visit: Payer: Self-pay

## 2019-08-15 ENCOUNTER — Ambulatory Visit: Payer: Medicare Other | Admitting: Physical Therapy

## 2019-08-15 ENCOUNTER — Encounter: Payer: Self-pay | Admitting: Physical Therapy

## 2019-08-15 DIAGNOSIS — R29898 Other symptoms and signs involving the musculoskeletal system: Secondary | ICD-10-CM

## 2019-08-15 DIAGNOSIS — M6281 Muscle weakness (generalized): Secondary | ICD-10-CM

## 2019-08-15 DIAGNOSIS — M25512 Pain in left shoulder: Secondary | ICD-10-CM

## 2019-08-15 DIAGNOSIS — M25612 Stiffness of left shoulder, not elsewhere classified: Secondary | ICD-10-CM

## 2019-08-15 NOTE — Therapy (Signed)
[facility] [address]  [place], [place], [address] Phone: [phone]   Fax:  [phone]  Physical Therapy Treatment  Patient Details  Name: Shannon Delacruz MRN: [id number] Date of Birth: 1961/01/14 Referring Provider (PT): Vonna Drafts, MD   Encounter Date: 08/15/2019  PT End of Session - 08/15/19 0956    Visit Number  6    Number of Visits  13    Date for PT Re-Evaluation  08/30/19    Authorization Type  UHC & Medicare    PT Start Time  0914    PT Stop Time  1008   moist heat   PT Time Calculation (min)  54 min    Activity Tolerance  Patient tolerated treatment well;Patient limited by pain    Behavior During Therapy  [facility] for tasks assessed/performed       Past Medical History:  Diagnosis Date  . Cancer ([place])    UTERINE  . DDD (degenerative disc disease), lumbar   . Osteoarthritis     Past Surgical History:  Procedure Laterality Date  . ABDOMINAL HYSTERECTOMY    . APPENDECTOMY    . BACK SURGERY  2015  . HIP SURGERY      There were no vitals filed for this visit.  Subjective Assessment - 08/15/19 0915    Subjective  Feeling like her shoulder is getting better. Able to reach behind her back a little higher.    Pertinent History  OA, lumbar DDD, uterine CA, hip surgery, back surgery    Diagnostic tests  none recent    Patient Stated Goals  work on mobility    Currently in Pain?  Yes    Pain Score  2     Pain Location  Shoulder    Pain Orientation  Left;Anterior    Pain Descriptors / Indicators  Aching    Pain Type  Chronic pain                       [facility] Adult PT Treatment/Exercise - 08/15/19 0001      Shoulder Exercises: Supine   External Rotation  AROM;Left;10 reps;Strengthening    External Rotation Weight (lbs)  1    External Rotation Limitations  with shoulder at ~90 deg and PT assisting to maintain in this position    Internal Rotation  Strengthening;AROM;Left;10  reps;Weights    Internal Rotation Weight (lbs)  1    Internal Rotation Limitations  with shoulder at ~90 deg and PT assisting to maintain in this position    Flexion  Strengthening;Left;10 reps;Weights    Shoulder Flexion Weight (lbs)  3    Flexion Limitations  cues for slow controlled pace      Shoulder Exercises: Seated   Flexion  Strengthening;Left;10 reps;Weights    Flexion Weight (lbs)  1    Flexion Limitations  thumb up; stopping at 90 to avoid shoulder hike; cues to encourage scapular upward rotation    Abduction  Strengthening;Left;10 reps;Weights    ABduction Weight (lbs)  1    ABduction Limitations  thumb up; stopping at 80 to avoid shoulder hike; cues to encourage scapular upward rotation    Other Seated Exercises  L shoulder scaption with 1# to ~80 deg to avoid shoulder hiking x10      Shoulder Exercises: Standing   Extension  AAROM;Left;10 reps    Extension Limitations  10x3" with wand to tolerance      Shoulder Exercises: Pulleys  Flexion  3 minutes    Flexion Limitations  to tolerance    Scaption  3 minutes    Scaption Limitations  to tolerance      Modalities   Modalities  Moist Heat      Moist Heat Therapy   Number Minutes Moist Heat  10 Minutes    Moist Heat Location  Shoulder   L     Manual Therapy   Manual Therapy  Passive ROM;Muscle Energy Technique    Manual therapy comments  supine    Passive ROM  L shoulder PROM all directions with focus on extension, IR, and ER to tolerance    Muscle Energy Technique  L shoulder IR/ER MET with holds at end range 5x20" each             PT Education - 08/15/19 0956    Education Details  update to HEP    Person(s) Educated  Patient    Methods  Explanation;Demonstration;Tactile cues;Verbal cues;Handout    Comprehension  Verbalized understanding;Returned demonstration       PT Short Term Goals - 08/10/19 1227      PT SHORT TERM GOAL #1   Title  Patient to be independent with initial HEP.    Time  2     Period  Weeks    Status  Achieved    Target Date  08/16/19        PT Long Term Goals - 08/03/19 0936      PT LONG TERM GOAL #1   Title  Patient to be independent with advanced HEP.    Time  4    Period  Weeks    Status  On-going      PT LONG TERM GOAL #2   Title  Patient to demonstrate L shoulder AROM symmetrical to opposite UE and without pain limiting.    Time  4    Period  Weeks    Status  On-going      PT LONG TERM GOAL #3   Title  Patient to demonstrate L shoulder strength >=4+/5.    Time  4    Period  Weeks    Status  On-going      PT LONG TERM GOAL #4   Title  Patient to report 75% improvement in ability to reach behind her back.    Time  4    Period  Weeks    Status  On-going      PT LONG TERM GOAL #5   Title  Patient to demonstrate lifting 2lb weight to overhead shelf with L UE with good mechanics in order to improve ability to place mug up into microwave.    Time  4    Period  Weeks    Status  On-going            Plan - 08/15/19 0959    Clinical Impression Statement  Patient without new complaints at beginning of session. Worked on improving L shoulder extension and IR/ER with PROM and MET to decrease effects of muscle guarding. Patient demonstrated good improvement in IR/ER, most limited and painful in extension. Worked on addressing shoulder extension ROM with use of cane. Patient tolerated this exercise well, thus added it into HEP. Patient reported understanding. Patient still with tendency to hike L shoulder with AROM, with worked within more limited ROM with attention on scapular mechanics. Ended session with moist heat to L shoulder per patient's request. No complaints at end of session.  Comorbidities  OA, lumbar DDD, uterine CA, hip surgery, back surgery    Rehab Potential  Good    PT Treatment/Interventions  ADLs/Self Care Home Management;Cryotherapy;Electrical Stimulation;Moist Heat;Therapeutic exercise;Therapeutic activities;Functional  mobility training;Ultrasound;Neuromuscular re-education;Patient/family education;Manual techniques;Vasopneumatic Device;Taping;Energy conservation;Dry needling;Passive range of motion;Scar mobilization    PT Next Visit Plan  Monitor response to progression of ROM activities; monitor tolerance for self-STM with ball on wall to posterior shoulder    Consulted and Agree with Plan of Care  Patient       Patient will benefit from skilled therapeutic intervention in order to improve the following deficits and impairments:  Hypomobility, Decreased activity tolerance, Decreased strength, Increased fascial restricitons, Impaired UE functional use, Pain, Improper body mechanics, Decreased range of motion, Impaired flexibility, Postural dysfunction  Visit Diagnosis: Acute pain of left shoulder  Stiffness of left shoulder, not elsewhere classified  Muscle weakness (generalized)  Other symptoms and signs involving the musculoskeletal system     Problem List There are no active problems to display for this patient.     Janene Harvey, PT, DPT 08/15/19 10:09 AM   [facility] [address]  [place], [place], [address] Phone: [phone]   Fax:  [phone]  Name: Shannon Delacruz MRN: [id number] Date of Birth: 09-12-1960

## 2019-08-17 ENCOUNTER — Ambulatory Visit: Payer: Medicare Other

## 2019-08-17 ENCOUNTER — Other Ambulatory Visit: Payer: Self-pay

## 2019-08-17 DIAGNOSIS — R29898 Other symptoms and signs involving the musculoskeletal system: Secondary | ICD-10-CM

## 2019-08-17 DIAGNOSIS — M25612 Stiffness of left shoulder, not elsewhere classified: Secondary | ICD-10-CM

## 2019-08-17 DIAGNOSIS — M6281 Muscle weakness (generalized): Secondary | ICD-10-CM

## 2019-08-17 DIAGNOSIS — M25512 Pain in left shoulder: Secondary | ICD-10-CM

## 2019-08-17 NOTE — Therapy (Signed)
[facility] [address]  [place], [place], [address] Phone: [phone]   Fax:  [phone]  Physical Therapy Treatment  Patient Details  Name: Shannon Delacruz MRN: [id number] Date of Birth: February 12, 1961 Referring Provider (PT): Shannon Drafts, MD   Encounter Date: 08/17/2019  PT End of Session - 08/17/19 0936    Visit Number  7    Number of Visits  13    Date for PT Re-Evaluation  08/30/19    Authorization Type  UHC & Medicare    PT Start Time  0933    PT Stop Time  [id number]   Ended visit with 10 min moist heat   PT Time Calculation (min)  67 min    Activity Tolerance  Patient tolerated treatment well;Patient limited by pain    Behavior During Therapy  [facility] for tasks assessed/performed       Past Medical History:  Diagnosis Date  . Cancer ([place])    UTERINE  . DDD (degenerative disc disease), lumbar   . Osteoarthritis     Past Surgical History:  Procedure Laterality Date  . ABDOMINAL HYSTERECTOMY    . APPENDECTOMY    . BACK SURGERY  2015  . HIP SURGERY      There were no vitals filed for this visit.  Subjective Assessment - 08/17/19 0948    Subjective  Pt. noting HEP going well.  notes posterior shoulder stretch at home is uncomfortable however shoulder feels better afte .    Pertinent History  OA, lumbar DDD, uterine CA, hip surgery, back surgery    Diagnostic tests  none recent    Patient Stated Goals  work on mobility    Currently in Pain?  Yes    Pain Score  0-No pain   up to 7/10 at most in end range flexion   Pain Location  Shoulder    Pain Orientation  Left;Anterior    Pain Descriptors / Indicators  Aching    Pain Type  Chronic pain    Pain Onset  More than a month ago    Pain Frequency  Intermittent    Aggravating Factors   end range flexion overhead    Multiple Pain Sites  No                       [facility] Adult PT Treatment/Exercise - 08/17/19 0001      Self-Care   Self-Care   Other Self-Care Comments    Other Self-Care Comments   instruction/performance of self-L shoulder anterior shoulder mobilizations sitting using chair backrest 5" x 10 reps       Shoulder Exercises: Supine   External Rotation  Left;10 reps;Strengthening;Theraband    Theraband Level (Shoulder External Rotation)  Level 1 (Yellow)    External Rotation Limitations  therapist anchored     Internal Rotation  Left;10 reps;Theraband;Strengthening    Theraband Level (Shoulder Internal Rotation)  Level 1 (Yellow)    Internal Rotation Limitations  therapist anchored     Flexion  AAROM;Left;10 reps    Flexion Limitations  2# on wand - 5 sec hold in end range flexion     ABduction  Left;10 reps;AAROM    ABduction Limitations  wand scaption - 5" hold     Other Supine Exercises  --      Shoulder Exercises: Standing   Other Standing Exercises  Pendulum L shoulder R/L, forward/backwards to relax shoulder musculature x 30 sec each way  Shoulder Exercises: Pulleys   Flexion  2 minutes    Flexion Limitations  to tolerance    Scaption  2 minutes    Scaption Limitations  to tolerance      Shoulder Exercises: ROM/Strengthening   UBE (Upper Arm Bike)  Lvl 1.0, 3 min forwards/ 3 min backwards       Shoulder Exercises: Stretch   Corner Stretch  2 reps;30 seconds    Corner Stretch Limitations  low, mid on doorway     Internal Rotation Stretch  2 reps   2 x 30 sec sidelying "sleeper stretch"     Moist Heat Therapy   Number Minutes Moist Heat  10 Minutes    Moist Heat Location  Shoulder   posterior/anterior/lateral     Manual Therapy   Manual Therapy  Passive ROM;Muscle Energy Technique    Manual therapy comments  supine    Soft tissue mobilization  STM/DTM to L pec, L anterior delt, lateral delt, proximal biceps, infra, teres group    most tenderness in L anterior delt, proximal biceps   Passive ROM  L shoulder PROM all directions with focus on extension, IR, and ER to tolerance; manual L pec  stretch in varying positions, L biceps stretch into elbow extension, forearm pronation and shoulder extension     Muscle Energy Technique  L shoulder IR/ER contract/relax stretching with therapist                PT Short Term Goals - 08/10/19 1227      PT SHORT TERM GOAL #1   Title  Patient to be independent with initial HEP.    Time  2    Period  Weeks    Status  Achieved    Target Date  08/16/19        PT Long Term Goals - 08/03/19 0936      PT LONG TERM GOAL #1   Title  Patient to be independent with advanced HEP.    Time  4    Period  Weeks    Status  On-going      PT LONG TERM GOAL #2   Title  Patient to demonstrate L shoulder AROM symmetrical to opposite UE and without pain limiting.    Time  4    Period  Weeks    Status  On-going      PT LONG TERM GOAL #3   Title  Patient to demonstrate L shoulder strength >=4+/5.    Time  4    Period  Weeks    Status  On-going      PT LONG TERM GOAL #4   Title  Patient to report 75% improvement in ability to reach behind her back.    Time  4    Period  Weeks    Status  On-going      PT LONG TERM GOAL #5   Title  Patient to demonstrate lifting 2lb weight to overhead shelf with L UE with good mechanics in order to improve ability to place mug up into microwave.    Time  4    Period  Weeks    Status  On-going            Plan - 08/17/19 [id number]    Clinical Impression Statement  Denize reporting L shoulder, "feels surprisingly good" today.  MT with more aggressive approach with STM/DTM to anterior, lateral, posterior shoulder targeting most tender area in proximal biceps, anterior deltoid.  Continued manual  contract/relax stretching today for improved L shoulder ROM.  Lavila noting she feels her FIR motion behind back seems to be somewhat improved over this past week.  Encouraged pt. to continue daily adherence to full HEP for full benefit from therapy.  Progressed shoulder IR/ER strengthening today in supine with good  tolerance.  Will continue to progress toward goals.    Comorbidities  OA, lumbar DDD, uterine CA, hip surgery, back surgery    PT Treatment/Interventions  ADLs/Self Care Home Management;Cryotherapy;Electrical Stimulation;Moist Heat;Therapeutic exercise;Therapeutic activities;Functional mobility training;Ultrasound;Neuromuscular re-education;Patient/family education;Manual techniques;Vasopneumatic Device;Taping;Energy conservation;Dry needling;Passive range of motion;Scar mobilization    PT Next Visit Plan  progress ROM and strengthening activities    Consulted and Agree with Plan of Care  Patient       Patient will benefit from skilled therapeutic intervention in order to improve the following deficits and impairments:  Hypomobility, Decreased activity tolerance, Decreased strength, Increased fascial restricitons, Impaired UE functional use, Pain, Improper body mechanics, Decreased range of motion, Impaired flexibility, Postural dysfunction  Visit Diagnosis: Acute pain of left shoulder  Stiffness of left shoulder, not elsewhere classified  Muscle weakness (generalized)  Other symptoms and signs involving the musculoskeletal system     Problem List There are no active problems to display for this patient.   Bess Harvest, PTA 08/17/19 1:06 PM   [facility] [address]  [place], [place], [address] Phone: [phone]   Fax:  [phone]  Name: Samanda Delker MRN: [id number] Date of Birth: [birth year]

## 2019-08-18 ENCOUNTER — Ambulatory Visit: Payer: Medicare Other | Admitting: Physical Therapy

## 2019-08-18 ENCOUNTER — Encounter: Payer: Self-pay | Admitting: Physical Therapy

## 2019-08-18 DIAGNOSIS — M25612 Stiffness of left shoulder, not elsewhere classified: Secondary | ICD-10-CM

## 2019-08-18 DIAGNOSIS — M25512 Pain in left shoulder: Secondary | ICD-10-CM | POA: Diagnosis not present

## 2019-08-18 DIAGNOSIS — R29898 Other symptoms and signs involving the musculoskeletal system: Secondary | ICD-10-CM

## 2019-08-18 DIAGNOSIS — M6281 Muscle weakness (generalized): Secondary | ICD-10-CM

## 2019-08-18 NOTE — Therapy (Signed)
[facility] [address]  [place], [place], [address] Phone: [phone]   Fax:  [phone]  Physical Therapy Treatment  Patient Details  Name: Shannon Delacruz MRN: [id number] Date of Birth: 02-11-1961 Referring Provider (PT): Vonna Drafts, MD   Encounter Date: 08/18/2019  PT End of Session - 08/18/19 0942    Visit Number  8    Number of Visits  13    Date for PT Re-Evaluation  08/30/19    Authorization Type  UHC & Medicare    PT Start Time  0906    PT Stop Time  0944    PT Time Calculation (min)  38 min    Activity Tolerance  Patient tolerated treatment well    Behavior During Therapy  [facility] for tasks assessed/performed       Past Medical History:  Diagnosis Date  . Cancer ([place])    UTERINE  . DDD (degenerative disc disease), lumbar   . Osteoarthritis     Past Surgical History:  Procedure Laterality Date  . ABDOMINAL HYSTERECTOMY    . APPENDECTOMY    . BACK SURGERY  2015  . HIP SURGERY      There were no vitals filed for this visit.  Subjective Assessment - 08/18/19 0907    Subjective  Was very sore after last session and still having soreness today. Had considerable pain last night from the trigger point massage.    Pertinent History  OA, lumbar DDD, uterine CA, hip surgery, back surgery    Diagnostic tests  none recent    Patient Stated Goals  work on mobility    Currently in Pain?  Yes    Pain Score  6     Pain Location  Shoulder    Pain Orientation  Left;Anterior    Pain Descriptors / Indicators  Aching    Pain Type  Chronic pain                       [facility] Adult PT Treatment/Exercise - 08/18/19 0001      Exercises   Exercises  Shoulder      Shoulder Exercises: Supine   Protraction  Strengthening;Left;12 reps;Weights    Protraction Weight (lbs)  4    Protraction Limitations  2x10; serratus punch   lmited ROM   External Rotation  Left;10 reps;Strengthening;Theraband    External Rotation Weight (lbs)  1,2     External Rotation Limitations  10x 1#, 5x 2#; shoulder abducted to 90 deg with PT anchoring elbow down    Internal Rotation  Left;10 reps;Weights    Internal Rotation Weight (lbs)  1, 2    Internal Rotation Limitations  10x 1#, 5x 2#; shoulder abducted to 90 deg with PT anchoring elbow down    Flexion  Strengthening;Left;10 reps;Weights    Shoulder Flexion Weight (lbs)  3    Flexion Limitations  c/o difficulty      Shoulder Exercises: Prone   Flexion  Left;Right;10 reps    Flexion Limitations  prone over counter I's    Other Prone Exercises  L shoulder Y's prone over counter x10      Shoulder Exercises: Standing   Flexion  AAROM;Strengthening;Left;5 reps    Flexion Limitations  flexion wall slides + lift     Other Standing Exercises  L shoulder scaption wall slides + lift off x5       Shoulder Exercises: Pulleys   Flexion  3 minutes  Flexion Limitations  to tolerance    Scaption  3 minutes    Scaption Limitations  to tolerance      Manual Therapy   Manual Therapy  Passive ROM    Manual therapy comments  supine    Passive ROM  L shoulder PROM all directions to tolerance               PT Short Term Goals - 08/10/19 1227      PT SHORT TERM GOAL #1   Title  Patient to be independent with initial HEP.    Time  2    Period  Weeks    Status  Achieved    Target Date  08/16/19        PT Long Term Goals - 08/03/19 0936      PT LONG TERM GOAL #1   Title  Patient to be independent with advanced HEP.    Time  4    Period  Weeks    Status  On-going      PT LONG TERM GOAL #2   Title  Patient to demonstrate L shoulder AROM symmetrical to opposite UE and without pain limiting.    Time  4    Period  Weeks    Status  On-going      PT LONG TERM GOAL #3   Title  Patient to demonstrate L shoulder strength >=4+/5.    Time  4    Period  Weeks    Status  On-going      PT LONG TERM GOAL #4   Title  Patient to report 75%  improvement in ability to reach behind her back.    Time  4    Period  Weeks    Status  On-going      PT LONG TERM GOAL #5   Title  Patient to demonstrate lifting 2lb weight to overhead shelf with L UE with good mechanics in order to improve ability to place mug up into microwave.    Time  4    Period  Weeks    Status  On-going            Plan - 08/18/19 0948    Clinical Impression Statement  Patient reporting considerable pain after "last session's trigger point release", which lasted into last night and into today. Patient however reporting that she understanding the need for therapy to be "aggressive" per MD's request. Tolerated L shoulder PROM in all directions without complaints. Able to tolerate an increase in weighted resistance with supine IR/ER with report of increased difficulty, but otherwise good maintenance of form. Supine overhead flexion with weight continues to challenge patient. Increased weighted resistance with serratus punches with visibly reduction in ROM d/t weakness. Patient did report a reduction in pain after ther-ex today. Ended session without modalities as patient requesting to leave early d/t another appointment.    Comorbidities  OA, lumbar DDD, uterine CA, hip surgery, back surgery    PT Treatment/Interventions  ADLs/Self Care Home Management;Cryotherapy;Electrical Stimulation;Moist Heat;Therapeutic exercise;Therapeutic activities;Functional mobility training;Ultrasound;Neuromuscular re-education;Patient/family education;Manual techniques;Vasopneumatic Device;Taping;Energy conservation;Dry needling;Passive range of motion;Scar mobilization    PT Next Visit Plan  progress ROM and strengthening activities    Consulted and Agree with Plan of Care  Patient       Patient will benefit from skilled therapeutic intervention in order to improve the following deficits and impairments:  Hypomobility, Decreased activity tolerance, Decreased strength, Increased fascial  restricitons, Impaired UE functional use, Pain, Improper body  mechanics, Decreased range of motion, Impaired flexibility, Postural dysfunction  Visit Diagnosis: Acute pain of left shoulder  Stiffness of left shoulder, not elsewhere classified  Muscle weakness (generalized)  Other symptoms and signs involving the musculoskeletal system     Problem List There are no problems to display for this patient.   Janene Harvey, PT, DPT 08/18/19 9:52 AM   [facility] [address]  [place], [place], [address] Phone: [phone]   Fax:  [phone]  Name: Tinsleigh Larance MRN: [id number] Date of Birth: 1961-03-22

## 2019-08-26 ENCOUNTER — Other Ambulatory Visit: Payer: Self-pay

## 2019-08-26 ENCOUNTER — Ambulatory Visit: Payer: Medicare Other

## 2019-08-26 DIAGNOSIS — M25512 Pain in left shoulder: Secondary | ICD-10-CM | POA: Diagnosis not present

## 2019-08-26 DIAGNOSIS — M25612 Stiffness of left shoulder, not elsewhere classified: Secondary | ICD-10-CM

## 2019-08-26 DIAGNOSIS — R29898 Other symptoms and signs involving the musculoskeletal system: Secondary | ICD-10-CM

## 2019-08-26 DIAGNOSIS — M6281 Muscle weakness (generalized): Secondary | ICD-10-CM

## 2019-08-26 NOTE — Therapy (Addendum)
[facility] [address]  [place], [place], [address] Phone: [phone]   Fax:  [phone]  Physical Therapy Treatment  Patient Details  Name: Shannon Delacruz MRN: [id number] Date of Birth: [birth year] Referring Provider (PT): Vonna Drafts, MD   Encounter Date: 08/26/2019  PT End of Session - 08/26/19 1118    Visit Number  9    Number of Visits  13    Date for PT Re-Evaluation  08/30/19    Authorization Type  UHC & Medicare    PT Start Time  1102    PT Stop Time  1150    PT Time Calculation (min)  48 min    Activity Tolerance  Patient tolerated treatment well    Behavior During Therapy  [facility] for tasks assessed/performed       Past Medical History:  Diagnosis Date  . Cancer ([place])    UTERINE  . DDD (degenerative disc disease), lumbar   . Osteoarthritis     Past Surgical History:  Procedure Laterality Date  . ABDOMINAL HYSTERECTOMY    . APPENDECTOMY    . BACK SURGERY  2015  . HIP SURGERY      There were no vitals filed for this visit.  Subjective Assessment - 08/26/19 1117    Subjective  Doing well.  Notes improved ROM behind back.    Pertinent History  OA, lumbar DDD, uterine CA, hip surgery, back surgery    Diagnostic tests  none recent    Patient Stated Goals  work on mobility    Currently in Pain?  No/denies    Pain Score  0-No pain    Pain Location  Shoulder    Pain Orientation  Left;Anterior    Pain Descriptors / Indicators  Aching    Pain Type  Chronic pain    Pain Onset  More than a month ago    Multiple Pain Sites  No         [facility] PT Assessment - 08/26/19 0001      Assessment   Medical Diagnosis  Unspecified RTC tear or rupture of L shoulder, not specified as traumatic    Referring Provider (PT)  Vonna Drafts, MD    Onset Date/Surgical Date  03/03/19    Hand Dominance  Right    Next MD Visit  09/11/18    Prior Therapy  yes- L shoulder      ROM / Strength   AROM / PROM /  Strength  PROM      AROM   AROM Assessment Site  Shoulder    Right/Left Shoulder  Left    Left Shoulder Flexion  124 Degrees    Left Shoulder ABduction  110 Degrees    Left Shoulder Internal Rotation  --   FIR to L1   Left Shoulder External Rotation  --   FER to C7     PROM   PROM Assessment Site  Shoulder    Right/Left Shoulder  Left    Left Shoulder Flexion  135 Degrees    Left Shoulder ABduction  110 Degrees    Left Shoulder Internal Rotation  65 Degrees    Left Shoulder External Rotation  42 Degrees      Strength   Strength Assessment Site  Shoulder    Right/Left Shoulder  Right;Left    Right Shoulder Flexion  4+/5    Right Shoulder ABduction  4+/5    Right Shoulder Internal Rotation  4+/5  Right Shoulder External Rotation  4+/5    Left Shoulder Flexion  4+/5    Left Shoulder ABduction  4+/5    Left Shoulder Internal Rotation  4/5    Left Shoulder External Rotation  4+/5                   [facility] Adult PT Treatment/Exercise - 08/26/19 0001      Shoulder Exercises: Standing   Extension  Left;10 reps;Strengthening;Theraband    Theraband Level (Shoulder Extension)  Level 2 (Red)    Extension Limitations  Encouraged pt. to go into end range hyperextension     Row  Both;15 reps    Theraband Level (Shoulder Row)  Level 2 (Red)    Row Limitations  cues for scap. retraction       Shoulder Exercises: Pulleys   Flexion  3 minutes    Flexion Limitations  to tolerance    Scaption  3 minutes    Scaption Limitations  to tolerance      Manual Therapy   Manual Therapy  Passive ROM    Manual therapy comments  supine    Passive ROM  L shoulder PROM all directions with prolonged holds     Muscle Energy Technique  L shoulder IR/ER, flexion, abd contract/relax stretching with therapist              PT Education - 08/26/19 1233    Education Details  HEP update;  AAROM wand extension    Person(s) Educated  Patient    Methods   Explanation;Demonstration;Handout;Verbal cues    Comprehension  Verbalized understanding;Returned demonstration;Verbal cues required       PT Short Term Goals - 08/10/19 1227      PT SHORT TERM GOAL #1   Title  Patient to be independent with initial HEP.    Time  2    Period  Weeks    Status  Achieved    Target Date  08/16/19        PT Long Term Goals - 08/26/19 1145      PT LONG TERM GOAL #1   Title  Patient to be independent with advanced HEP.    Time  4    Period  Weeks    Status  Partially Met      PT LONG TERM GOAL #2   Title  Patient to demonstrate L shoulder AROM symmetrical to opposite UE and without pain limiting.    Time  4    Period  Weeks    Status  On-going      PT LONG TERM GOAL #3   Title  Patient to demonstrate L shoulder strength >=4+/5.    Time  4    Period  Weeks    Status  Partially Met   met for flexion, abd, ER     PT LONG TERM GOAL #4   Title  Patient to report 75% improvement in ability to reach behind her back.    Time  4    Period  Weeks    Status  Partially Met   08/26/19: pt. noting 50% improvement in ability to reach behind back     PT LONG TERM GOAL #5   Title  Patient to demonstrate lifting 2lb weight to overhead shelf with L UE with good mechanics in order to improve ability to place mug up into microwave.    Time  4    Period  Weeks    Status  Partially Met  Able to raise 2lb into flexion motion above head to 2nd shelf above cabinet however with poor scapulohumeral rhythm with excessive scap. elevation           Plan - 08/26/19 1118    Clinical Impression Statement  Pt. making progress with L shoulder AROM especially into AROM flexion, FER (see flowsheet).  Progressing toward LTG #2.  Progressing toward LTG #3 partially achieving with remaining weakness with MMT into IR however met for other muscle groups at 4+/5.  Progressing toward lTG #4 partially achieved with pt. noting 50% improvement in ability to reach behind back  evident by [facility] improving to L1 level only to iliac crest at eval.  Able to partially achieve LTG #5 as she was able to reach 2lb to overhead cabinet into flexion motion however with poor scapulohumeral rhythm likely still limited muscular tightness/joint capsule restriction.  Session focused on aggressive manual contract/relax stretching, and IR/ER ROM activities which were tolerated well.  Pt. till very guarded at times with report of shoulder pain with stretching and ROM activities.  States she is performing HEP daily however at times with limited recall of activities with conversation.  Will likely continue to benefit from further skilled therapy to improve functional ROM and strength.    Comorbidities  OA, lumbar DDD, uterine CA, hip surgery, back surgery    Rehab Potential  Good    PT Treatment/Interventions  ADLs/Self Care Home Management;Cryotherapy;Electrical Stimulation;Moist Heat;Therapeutic exercise;Therapeutic activities;Functional mobility training;Ultrasound;Neuromuscular re-education;Patient/family education;Manual techniques;Vasopneumatic Device;Taping;Energy conservation;Dry needling;Passive range of motion;Scar mobilization    PT Next Visit Plan  10th visit;  FOTO ; progress ROM and strengthening activities    Consulted and Agree with Plan of Care  Patient       Patient will benefit from skilled therapeutic intervention in order to improve the following deficits and impairments:  Hypomobility, Decreased activity tolerance, Decreased strength, Increased fascial restricitons, Impaired UE functional use, Pain, Improper body mechanics, Decreased range of motion, Impaired flexibility, Postural dysfunction  Visit Diagnosis: Acute pain of left shoulder  Stiffness of left shoulder, not elsewhere classified  Muscle weakness (generalized)  Other symptoms and signs involving the musculoskeletal system     Problem List There are no problems to display for this patient.   Bess Harvest,  PTA 08/26/19 12:33 PM    [facility] [address]  [place], [place], [address] Phone: [phone]   Fax:  [phone]  Name: Fawna Cranmer MRN: [id number] Date of Birth: [birth year]

## 2019-08-29 ENCOUNTER — Other Ambulatory Visit: Payer: Self-pay

## 2019-08-29 ENCOUNTER — Encounter: Payer: Self-pay | Admitting: Physical Therapy

## 2019-08-29 ENCOUNTER — Ambulatory Visit: Payer: Medicare Other | Admitting: Physical Therapy

## 2019-08-29 DIAGNOSIS — M25512 Pain in left shoulder: Secondary | ICD-10-CM | POA: Diagnosis not present

## 2019-08-29 DIAGNOSIS — R29898 Other symptoms and signs involving the musculoskeletal system: Secondary | ICD-10-CM

## 2019-08-29 DIAGNOSIS — M25612 Stiffness of left shoulder, not elsewhere classified: Secondary | ICD-10-CM

## 2019-08-29 DIAGNOSIS — M6281 Muscle weakness (generalized): Secondary | ICD-10-CM

## 2019-08-29 NOTE — Therapy (Addendum)
[facility] [address]  [place], [place], [address] Phone: [phone]   Fax:  [phone]  Physical Therapy Progress Note  Patient Details  Name: Shannon Delacruz MRN: [id number] Date of Birth: 1960/11/03 Referring Provider (PT): Vonna Drafts, MD     Progress Note Reporting Period 08/02/19 to 08/29/19  See note below for Objective Data and Assessment of Progress/Goals.     Encounter Date: 08/29/2019  PT End of Session - 08/29/19 1214    Visit Number  10    Number of Visits  13    Date for PT Re-Evaluation  08/30/19    Authorization Type  UHC & Medicare    PT Start Time  1019    PT Stop Time  1055    PT Time Calculation (min)  36 min    Activity Tolerance  Patient tolerated treatment well    Behavior During Therapy  [facility] for tasks assessed/performed       Past Medical History:  Diagnosis Date  . Cancer ([place])    UTERINE  . DDD (degenerative disc disease), lumbar   . Osteoarthritis     Past Surgical History:  Procedure Laterality Date  . ABDOMINAL HYSTERECTOMY    . APPENDECTOMY    . BACK SURGERY  2015  . HIP SURGERY      There were no vitals filed for this visit.  Subjective Assessment - 08/29/19 1020    Subjective  Doing well. Feels that she is ready to wrap up with PT today- feels like she can continue with the exercises at home. Reporting 50% improvement in L shoulder at this time. Noting that she is better able to reach behind her back.    Pertinent History  OA, lumbar DDD, uterine CA, hip surgery, back surgery    Diagnostic tests  none recent    Patient Stated Goals  work on mobility    Currently in Pain?  No/denies    Pain Onset  More than a month ago         [facility] PT Assessment - 08/29/19 0001      Assessment   Medical Diagnosis  Unspecified RTC tear or rupture of L shoulder, not specified as traumatic    Referring Provider (PT)  Vonna Drafts, MD    Onset Date/Surgical Date   03/03/19      Observation/Other Assessments   Focus on Therapeutic Outcomes (FOTO)   Shoulder: 70 (30% limited, 37% predicted)      AROM   Left Shoulder Flexion  124 Degrees    Left Shoulder ABduction  110 Degrees    Left Shoulder Internal Rotation  --   FIR L1   Left Shoulder External Rotation  --   FER C7     PROM   Left Shoulder Flexion  135 Degrees    Left Shoulder ABduction  110 Degrees    Left Shoulder Internal Rotation  65 Degrees    Left Shoulder External Rotation  42 Degrees      Strength   Left Shoulder Flexion  4+/5    Left Shoulder ABduction  4+/5    Left Shoulder Internal Rotation  4/5    Left Shoulder External Rotation  4+/5                   [facility] Adult PT Treatment/Exercise - 08/29/19 0001      Shoulder Exercises: Seated   External Rotation  Strengthening;Both;10 reps;Theraband    Theraband Level (  Shoulder External Rotation)  Level 2 (Red)    External Rotation Limitations  good tolerance      Shoulder Exercises: ROM/Strengthening   UBE (Upper Arm Bike)  Lvl 1.0, 3 min forwards/ 3 min backwards       Shoulder Exercises: Stretch   Corner Stretch  1 rep;3 reps;30 seconds    Corner Stretch Limitations  low and mid    Other Shoulder Stretches  L ER stretch in door 30" to tolerance             PT Education - 08/29/19 1213    Education Details  discussion on objective progress and remaining impairments; update and consolidation of HEP    Person(s) Educated  Patient    Methods  Explanation;Demonstration;Tactile cues;Verbal cues;Handout    Comprehension  Verbalized understanding;Returned demonstration       PT Short Term Goals - 08/10/19 1227      PT SHORT TERM GOAL #1   Title  Patient to be independent with initial HEP.    Time  2    Period  Weeks    Status  Achieved    Target Date  08/16/19        PT Long Term Goals - 08/29/19 1028      PT LONG TERM GOAL #1   Title  Patient to be independent with advanced HEP.    Time  4     Period  Weeks    Status  Achieved      PT LONG TERM GOAL #2   Title  Patient to demonstrate L shoulder AROM symmetrical to opposite UE and without pain limiting.    Time  4    Period  Weeks    Status  Partially Met      PT LONG TERM GOAL #3   Title  Patient to demonstrate L shoulder strength >=4+/5.    Time  4    Period  Weeks    Status  Partially Met   met for flexion, abd, ER     PT LONG TERM GOAL #4   Title  Patient to report 75% improvement in ability to reach behind her back.    Time  4    Period  Weeks    Status  Partially Met   08/26/19: pt. noting 50% improvement in ability to reach behind back     PT LONG TERM GOAL #5   Title  Patient to demonstrate lifting 2lb weight to overhead shelf with L UE with good mechanics in order to improve ability to place mug up into microwave.    Time  4    Period  Weeks    Status  Partially Met   Able to raise 2lb into flexion motion above head to 2nd shelf above cabinet however with poor scapulohumeral rhythm with excessive scap. elevation           Plan - 08/29/19 1215    Clinical Impression Statement  Patient reporting 50% improvement in L shoulder since current POC. Does note improvement in reaching behind her back and ability to lift coffee cup into microwave. Feels content with CLOF and planning to continue with her HEP at home. Patient has met or partially met all goals at this time- see measurements taken on 08/26/19. Some limitation still exists in L shoulder ROM, strength, and overhead lifting, but patient is able to perform all tasks at home without much limitation. Spoke with patient about her remaining limitations and updated HEP accordingly. Trialed  these exercises with patient for good understanding. Patient reported understanding and with no complaints at end of session. Placing patient on 30 day hold at this time d/t satisfaction with CLOF.    Comorbidities  OA, lumbar DDD, uterine CA, hip surgery, back surgery     Rehab Potential  Good    PT Treatment/Interventions  ADLs/Self Care Home Management;Cryotherapy;Electrical Stimulation;Moist Heat;Therapeutic exercise;Therapeutic activities;Functional mobility training;Ultrasound;Neuromuscular re-education;Patient/family education;Manual techniques;Vasopneumatic Device;Taping;Energy conservation;Dry needling;Passive range of motion;Scar mobilization    PT Next Visit Plan  30 day hold at this time    Consulted and Agree with Plan of Care  Patient       Patient will benefit from skilled therapeutic intervention in order to improve the following deficits and impairments:  Hypomobility, Decreased activity tolerance, Decreased strength, Increased fascial restricitons, Impaired UE functional use, Pain, Improper body mechanics, Decreased range of motion, Impaired flexibility, Postural dysfunction  Visit Diagnosis: Acute pain of left shoulder  Stiffness of left shoulder, not elsewhere classified  Muscle weakness (generalized)  Other symptoms and signs involving the musculoskeletal system     Problem List There are no problems to display for this patient.    Janene Harvey, PT, DPT 08/29/19 12:20 PM   [facility] [address]  [place], [place], [address] Phone: [phone]   Fax:  [phone]  Name: Shannon Delacruz MRN: [id number] Date of Birth: 1961-06-17  PHYSICAL THERAPY DISCHARGE SUMMARY  Visits from Start of Care: 10  Current functional level related to goals / functional outcomes: See above clinical impression; patient did not return after 30 day hold   Remaining deficits: Decreased ROM, strength, difficulty with reaching and lifting    Education / Equipment: HEP  Plan: Patient agrees to discharge.  Patient goals were partially met. Patient is being discharged due to being pleased with the current functional level.  ?????     Janene Harvey, PT, DPT 10/04/19  1:37 PM

## 2020-10-03 IMAGING — MR MR SHOULDER*R* W/CM
5 series · 40 of 40 positions shown · IV contrast (agent unspecified)
Comparison: None.

CLINICAL DATA: Chronic shoulder pain and weakness with limited
range of motion.

EXAM:
MR ARTHROGRAM OF THE RIGHT SHOULDER
TECHNIQUE: Multiplanar, multisequence MR imaging of the right shoulder was
performed following the administration of intra-articular contrast.
CONTRAST:  Right shoulder x-rays dated July 16, 2018.

[Series 3: T1 fat-sat · axial · 4.0mm · 0.27mm/px · z∈[-41,+48]mm · 10 of 20 slices shown (1 of 3)]
[im 1/20]
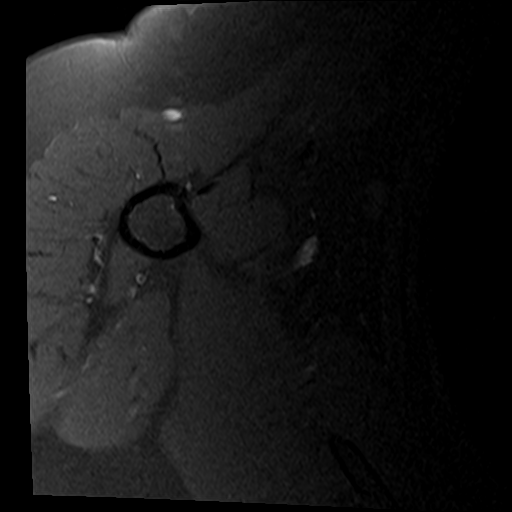
[im 3/20]
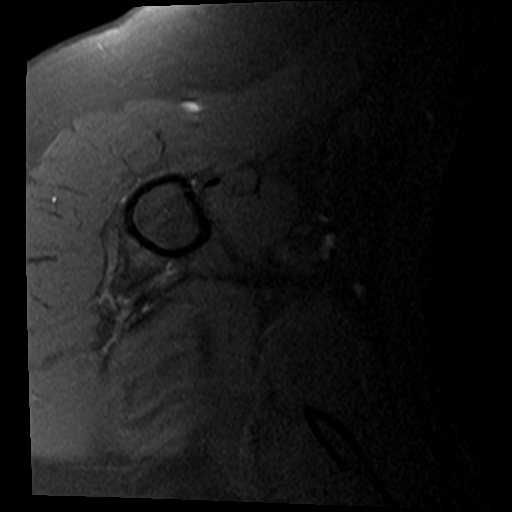
[im 5/20]
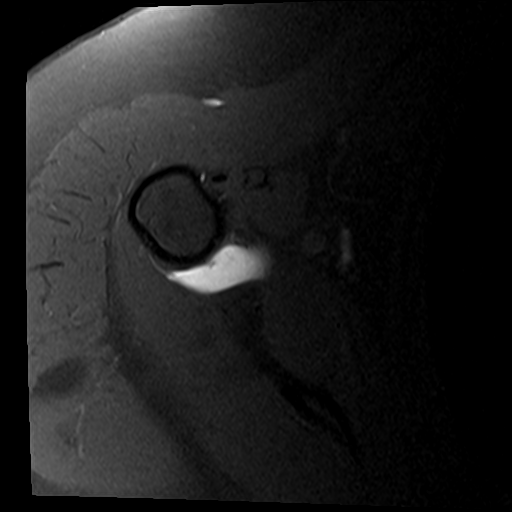
[im 7/20]
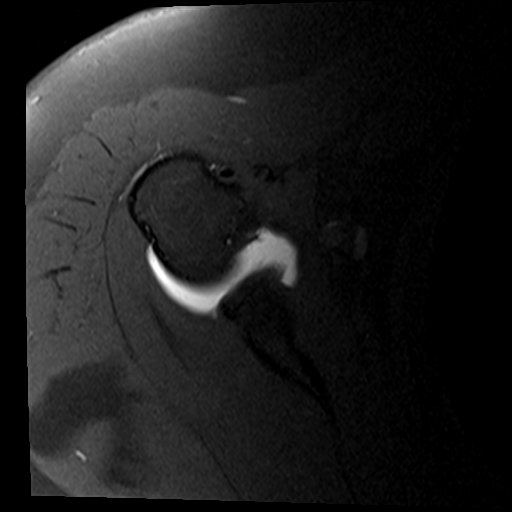
[im 9/20]
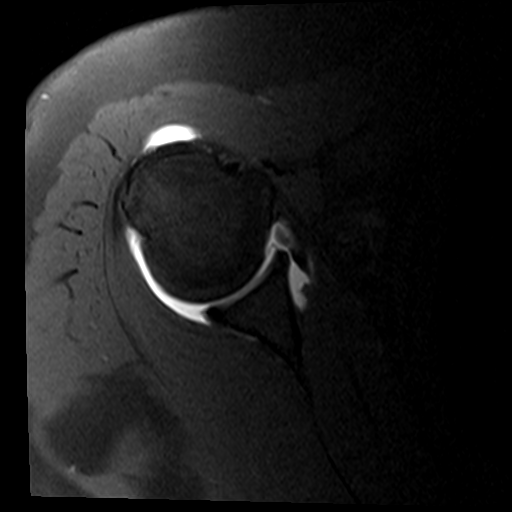
[im 11/20]
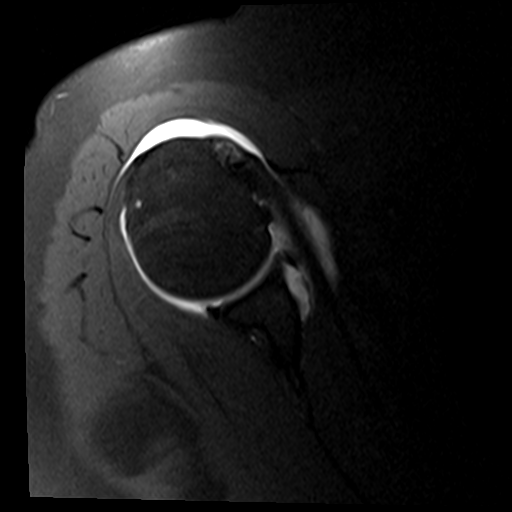
[im 13/20]
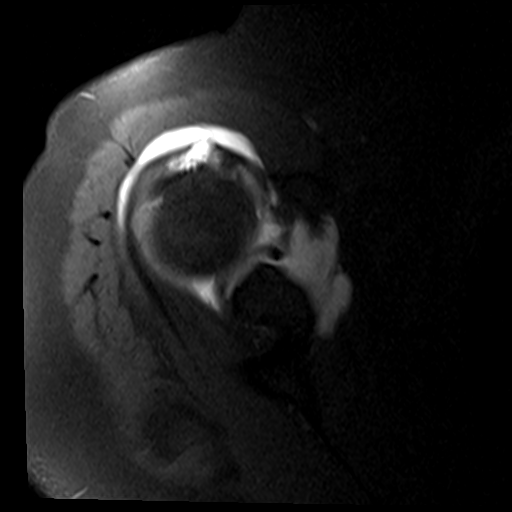
[im 15/20]
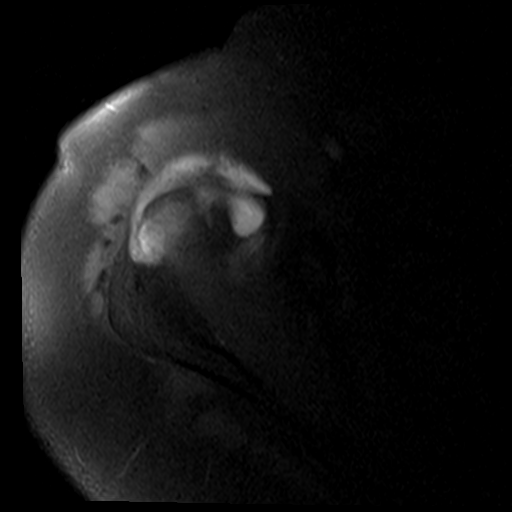
[im 17/20]
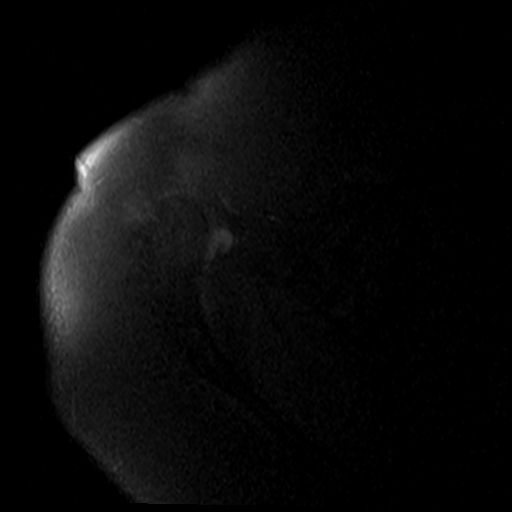
[im 20/20]
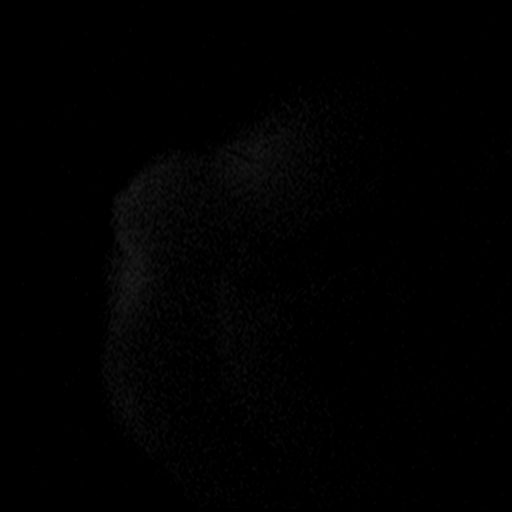

[Series 4: T2 fat-sat · coronal · 4.0mm · 0.55mm/px · 9 of 18 slices shown (1 of 2)]
[im 1/18]
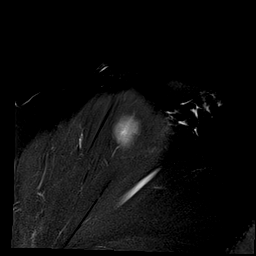
[im 3/18]
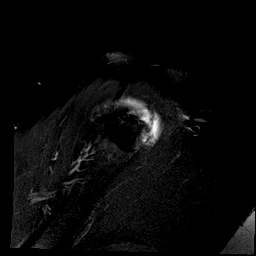
[im 5/18]
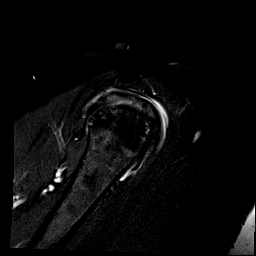
[im 7/18]
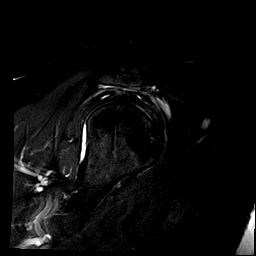
[im 9/18]
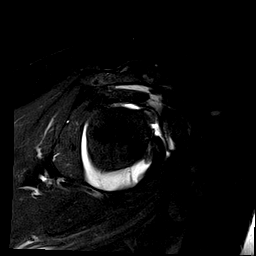
[im 11/18]
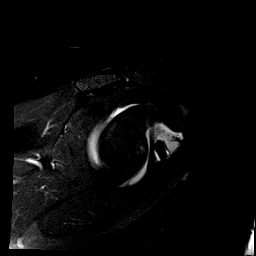
[im 13/18]
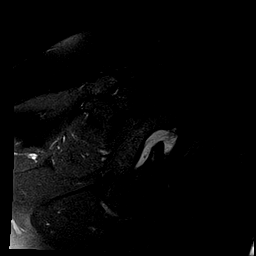
[im 15/18]
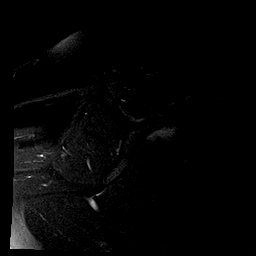
[im 18/18]
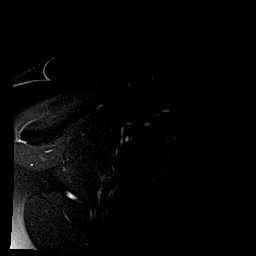

[Series 5: T1 fat-sat · sagittal · 4.0mm · 0.55mm/px · 7 of 16 slices shown (2 of 3)]
[im 1/16]
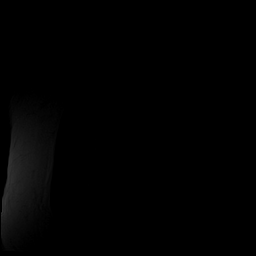
[im 3/16]
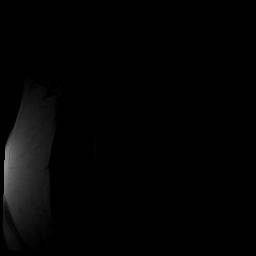
[im 6/16]
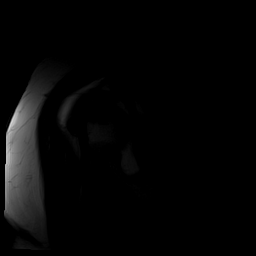
[im 8/16]
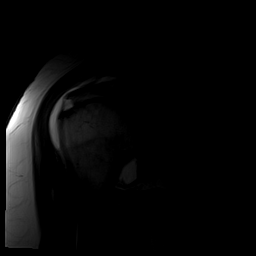
[im 11/16]
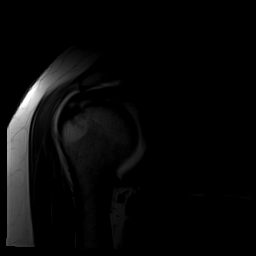
[im 13/16]
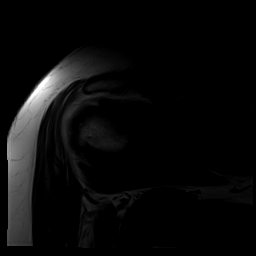
[im 16/16]
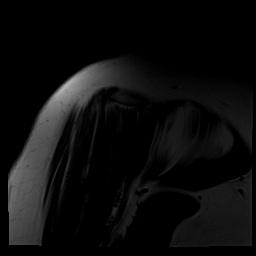

[Series 6: T1 fat-sat · sagittal · 4.0mm · 0.55mm/px · 7 of 16 slices shown (3 of 3)]
[im 1/16]
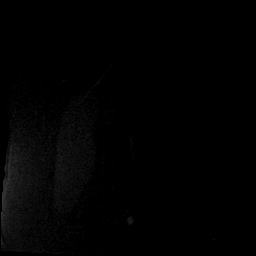
[im 3/16]
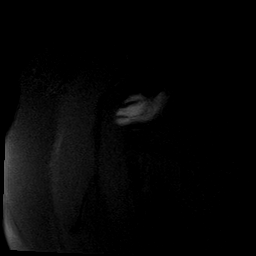
[im 6/16]
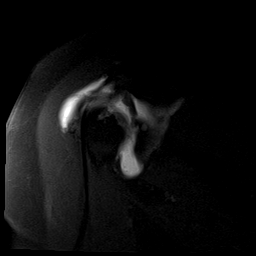
[im 8/16]
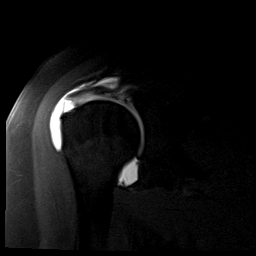
[im 11/16]
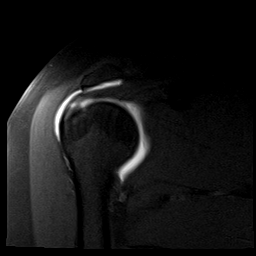
[im 13/16]
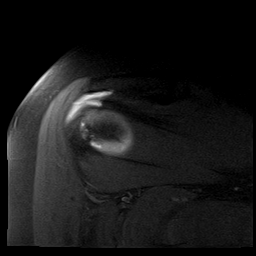
[im 16/16]
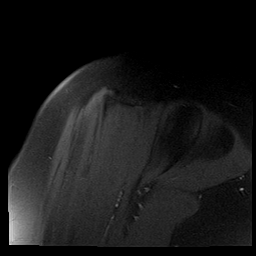

[Series 7: T2 fat-sat · sagittal · 4.0mm · 0.55mm/px · 7 of 16 slices shown (2 of 2)]
[im 1/16]
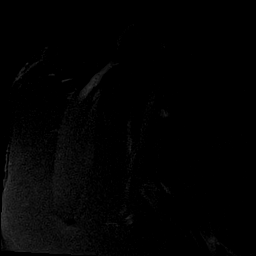
[im 3/16]
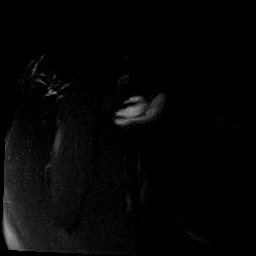
[im 6/16]
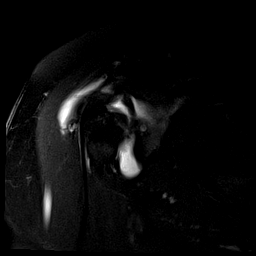
[im 8/16]
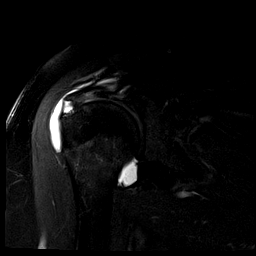
[im 11/16]
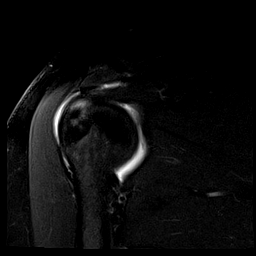
[im 13/16]
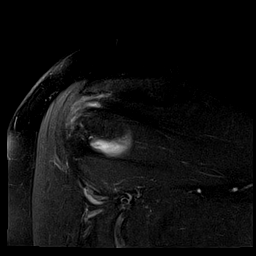
[im 16/16]
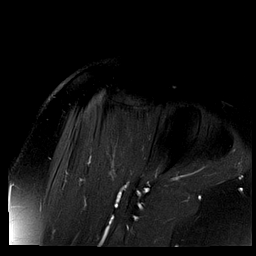

[40 of 40 positions shown; findings below may reference images not displayed]

FINDINGS: Rotator cuff: Moderate supraspinatus and infraspinatus tendinosis
with focal full-thickness tear of the distal supraspinatus tendon.
There is interstitial extension of the tear posteriorly into the
distal and mid infraspinatus tendon. The subscapularis and teres
minor tendons are unremarkable.

Muscles:  No focal muscular atrophy or edema.

Biceps long head: Intact and normally positioned. Moderate
intra-articular tendinosis.

Acromioclavicular Joint: The acromion is type I. Normal
acromioclavicular joint. Prominent contrast within the
subacromial/subdeltoid bursa.

Glenohumeral Joint: Distended with intra-articular contrast. No
chondral defect.

Labrum: Probable Lu Ie complex. Superior labral degeneration with
probable tear.

Bones: Reactive cystic changes in the greater tuberosity. No acute
fracture or dislocation. No focal bone lesion.

Other: None.
IMPRESSION: 1. Focal full-thickness, partial width tear of the distal
supraspinatus tendon. Interstitial extension of the tear posteriorly
into the distal and mid infraspinatus tendon.
2. Moderate intra-articular biceps tendinosis.
3. Superior labral degeneration with probable tear.

## 2021-04-24 ENCOUNTER — Ambulatory Visit: Payer: Medicare Other | Attending: Physical Medicine and Rehabilitation | Admitting: Physical Therapy

## 2021-04-24 ENCOUNTER — Encounter: Payer: Self-pay | Admitting: Physical Therapy

## 2021-04-24 ENCOUNTER — Other Ambulatory Visit: Payer: Self-pay

## 2021-04-24 DIAGNOSIS — M25551 Pain in right hip: Secondary | ICD-10-CM | POA: Diagnosis present

## 2021-04-24 DIAGNOSIS — R293 Abnormal posture: Secondary | ICD-10-CM | POA: Diagnosis present

## 2021-04-24 DIAGNOSIS — G8929 Other chronic pain: Secondary | ICD-10-CM

## 2021-04-24 DIAGNOSIS — M25512 Pain in left shoulder: Secondary | ICD-10-CM | POA: Insufficient documentation

## 2021-04-24 DIAGNOSIS — R252 Cramp and spasm: Secondary | ICD-10-CM | POA: Diagnosis present

## 2021-04-24 DIAGNOSIS — M6281 Muscle weakness (generalized): Secondary | ICD-10-CM | POA: Diagnosis present

## 2021-04-24 DIAGNOSIS — M545 Low back pain, unspecified: Secondary | ICD-10-CM | POA: Diagnosis present

## 2021-04-24 DIAGNOSIS — R29898 Other symptoms and signs involving the musculoskeletal system: Secondary | ICD-10-CM | POA: Insufficient documentation

## 2021-04-24 DIAGNOSIS — M25612 Stiffness of left shoulder, not elsewhere classified: Secondary | ICD-10-CM | POA: Diagnosis present

## 2021-04-24 NOTE — Therapy (Signed)
[facility] [address]  [place], [place], [address] Phone: [phone]   Fax:  [phone]  Physical Therapy Evaluation  Patient Details  Name: Shannon Delacruz MRN: [id number] Date of Birth: 28-Feb-1961 Referring Provider (PT): Suella Broad   Encounter Date: 04/24/2021   PT End of Session - 04/24/21 1424     Visit Number 1    Number of Visits 12    Date for PT Re-Evaluation 06/05/21    Authorization Type UHC Medicare/Medicaid    Progress Note Due on Visit 10    PT Start Time 0805    PT Stop Time 0845    PT Time Calculation (min) 40 min    Activity Tolerance Patient tolerated treatment well;Patient limited by pain    Behavior During Therapy [facility] for tasks assessed/performed             Past Medical History:  Diagnosis Date   Cancer ([place])    UTERINE   DDD (degenerative disc disease), lumbar    Osteoarthritis     Past Surgical History:  Procedure Laterality Date   ABDOMINAL HYSTERECTOMY     APPENDECTOMY     BACK SURGERY  2015   HIP SURGERY      There were no vitals filed for this visit.    Subjective Assessment - 04/24/21 0817     Subjective Patient reports she is here for her back.  she has history of spinal fusion in 2014, but has back pain, ended up in ER for pain on 01/19/2021, had MRI, has bulging disk and surgeon is talking about another fusion but no surgery until can lose enough weight, she asked for pain management, since she does stretches every day.  "What I am looking for with you guys (PT) is stretches, I have limitations with L hip, can't go to knee, other hip have bone on bone and goes numb."    Pertinent History history uterine cancer, DDD lumbar,  L hip arthroplasty, hysterectomy spinal fusion 2014    Limitations Walking;Standing;Lifting    How long can you sit comfortably? 1 hour    How long can you stand comfortably? 20 min    How long can you walk comfortably? 10 min- limited by  hip pain    Diagnostic tests MRI    Patient Stated Goals stretches to help with back pain    Currently in Pain? Yes    Pain Score 7     Pain Location Back   SIJ   Pain Orientation Right;Left    Pain Descriptors / Indicators Aching;Shooting;Sharp;Dull    Pain Type Acute pain;Chronic pain    Pain Onset Other (comment)   couple years   Pain Frequency Constant    Aggravating Factors  lifting, twisting    Pain Relieving Factors massage, ultrasound, myofascial release    Effect of Pain on Daily Activities increased pain after all activities, especially riding motorcycle.    Multiple Pain Sites Yes    Pain Score 9    Pain Location Hip    Pain Orientation Right    Pain Descriptors / Indicators Aching;Numbness    Pain Type Acute pain;Chronic pain    Pain Onset More than a month ago    Pain Frequency Constant                [facility] PT Assessment - 04/24/21 0001       Assessment   Medical Diagnosis postlaminectomy syndrome    Referring Provider (  PT) Nelva Bush, Richard    Onset Date/Surgical Date --   chronic, spinal fusion 2014   Hand Dominance Right    Next MD Visit 05/14/2021    Prior Therapy yes      Precautions   Precautions Other (comment)   history of cancer     Restrictions   Weight Bearing Restrictions No      Balance Screen   Has the patient fallen in the past 6 months No    Has the patient had a decrease in activity level because of a fear of falling?  No    Is the patient reluctant to leave their home because of a fear of falling?  No      Home Ecologist residence    Living Arrangements Spouse/significant other    Additional Comments moving      Prior Function   Level of Independence Independent    Vocation On disability    Leisure riding motorcycles      Cognition   Overall Cognitive Status Within Functional Limits for tasks assessed      Observation/Other Assessments   Observations patient enters independently, no apparent  distress, no AD.      Posture/Postural Control   Posture/Postural Control Postural limitations    Postural Limitations Rounded Shoulders;Forward head;Decreased lumbar lordosis      ROM / Strength   AROM / PROM / Strength Strength;AROM      AROM   Overall AROM  Deficits;Due to pain    Overall AROM Comments very guarded movements due to pain.    AROM Assessment Site Lumbar    Lumbar Flexion to knees    Lumbar Extension limited 75%    Lumbar - Right Side Bend to knee    Lumbar - Left Side Bend midthigh, limited 50% by pain.      Strength   Overall Strength Within functional limits for tasks performed    Overall Strength Comments 4/5 bil LE strength, very gaurded    Strength Assessment Site Hip;Knee;Ankle    Right/Left Hip Right;Left    Right Hip Flexion 4/5    Right Hip ABduction 4+/5    Right Hip ADduction 4+/5    Left Hip Flexion 4/5    Left Hip ABduction 4+/5    Left Hip ADduction 4+/5    Right/Left Knee Right;Left    Right Knee Flexion 4+/5    Right Knee Extension 4+/5    Left Knee Flexion 4+/5    Left Knee Extension 4+/5    Right/Left Ankle Right;Left    Right Ankle Dorsiflexion 4+/5    Left Ankle Dorsiflexion 4/5      Flexibility   Soft Tissue Assessment /Muscle Length yes    Hamstrings no tightness      Palpation   Palpation comment very guarded movements, unable tolerate prone position, hyperalgesia to light touch over back and SIJ      Special Tests   Other special tests negative SLR bil              Objective measurements completed on examination: See above findings.       PT Education - 04/24/21 1422     Education Details educated on findings and POC, initial movements focusing on side of preference (yummy) 2 x 10 for lower trunk rotation, upper trunk rotation, and arm raises for side flexion/extension to shorten tight muscles and decrease discomfort with movement.    Person(s) Educated Patient    Methods Explanation;Demonstration;Verbal cues  Comprehension Verbalized understanding;Returned demonstration              PT Short Term Goals - 04/24/21 1436       PT SHORT TERM GOAL #1   Title Pt. to complete FOTO for lumbar spine in 2 visits    Time 2    Period Weeks    Status New    Target Date 05/08/21               PT Long Term Goals - 04/24/21 1437       PT LONG TERM GOAL #1   Title Patient to be independent with advanced HEP.    Time 6    Period Weeks    Status New    Target Date 06/05/21      PT LONG TERM GOAL #2   Title Pt. to report 50% improvement in low back pain.    Time 6    Period Weeks    Status New    Target Date 06/05/21      PT LONG TERM GOAL #3   Title Pt. to demonstrate 4+/5 bil LE strength.    Time 6    Period Weeks    Status New    Target Date 06/05/21                    Plan - 04/24/21 1425     Clinical Impression Statement Pt. is a 60 year old female referred for back pain (post laminectomy syndrome) who reports history of uterine CA, DDD, L hip arthroplasty, R hip OA, and history of lumbar spinal fusion in 2014.  She is planning on another spinal fusion but needs to lose weight (adhering to diet).  She demonstrates decreased hip ROM bil, hyperanelgesia over lumbar paraspinals/ SIJ, and decreased hip/extensor strength.  She would benefit from skilled physical therapy to improve ROM, activity tolerance, and improve QOL.    Personal Factors and Comorbidities Comorbidity 3+    Comorbidities history uterine CA, DDD lumbar spine, history lumbar fusion 2014, L THA, R hip OA    Examination-Activity Limitations Bend;Lift;Squat;Stand;Locomotion Level;Stairs    Examination-Participation Restrictions Driving;Community Activity;Occupation    Stability/Clinical Decision Making Evolving/Moderate complexity    Clinical Decision Making Moderate    Rehab Potential Fair    PT Frequency 2x / week    PT Duration 6 weeks    PT Treatment/Interventions ADLs/Self Care Home  Management;Cryotherapy;Electrical Stimulation;Moist Heat;Gait training;Stair training;Functional mobility training;Therapeutic activities;Therapeutic exercise;Balance training;Neuromuscular re-education;Manual techniques;Passive range of motion;Dry needling;Taping;Spinal Manipulations;Joint Manipulations    PT Next Visit Plan FOTO for lumbar spine, review initial HEP and progress focusing on side of preference, core strengthening.  Modalities PRN    Consulted and Agree with Plan of Care Patient             Patient will benefit from skilled therapeutic intervention in order to improve the following deficits and impairments:  Decreased activity tolerance, Decreased endurance, Decreased range of motion, Decreased strength, Decreased mobility, Decreased safety awareness, Difficulty walking, Increased muscle spasms, Impaired flexibility, Postural dysfunction, Pain, Improper body mechanics, Impaired perceived functional ability  Visit Diagnosis: Chronic midline low back pain without sciatica  Abnormal posture  Muscle weakness (generalized)  Cramp and spasm  Pain in right hip     Problem List There are no problems to display for this patient.   Rennie Natter PT, DPT 04/24/2021, 2:43 PM  [facility] [address]  [address] [place]  [place], [place], [address] Phone: [phone]   Fax:  [phone]  Name: Shannon Delacruz MRN: [id number] Date of Birth: [birth year]

## 2021-04-30 ENCOUNTER — Encounter: Payer: Self-pay | Admitting: Physical Therapy

## 2021-04-30 ENCOUNTER — Ambulatory Visit: Payer: Medicare Other | Admitting: Physical Therapy

## 2021-04-30 ENCOUNTER — Other Ambulatory Visit: Payer: Self-pay

## 2021-04-30 DIAGNOSIS — M25612 Stiffness of left shoulder, not elsewhere classified: Secondary | ICD-10-CM

## 2021-04-30 DIAGNOSIS — R29898 Other symptoms and signs involving the musculoskeletal system: Secondary | ICD-10-CM

## 2021-04-30 DIAGNOSIS — G8929 Other chronic pain: Secondary | ICD-10-CM

## 2021-04-30 DIAGNOSIS — M545 Low back pain, unspecified: Secondary | ICD-10-CM | POA: Diagnosis not present

## 2021-04-30 DIAGNOSIS — M6281 Muscle weakness (generalized): Secondary | ICD-10-CM

## 2021-04-30 DIAGNOSIS — R252 Cramp and spasm: Secondary | ICD-10-CM

## 2021-04-30 DIAGNOSIS — M25512 Pain in left shoulder: Secondary | ICD-10-CM

## 2021-04-30 DIAGNOSIS — R293 Abnormal posture: Secondary | ICD-10-CM

## 2021-04-30 DIAGNOSIS — M25551 Pain in right hip: Secondary | ICD-10-CM

## 2021-04-30 NOTE — Therapy (Signed)
[facility] [address]  [place], [place], [address] Phone: [phone]   Fax:  [phone]  Physical Therapy Treatment  Patient Details  Name: Shannon Delacruz MRN: [id number] Date of Birth: 14-Nov-1960 Referring Provider (PT): Suella Broad   Encounter Date: 04/30/2021   PT End of Session - 04/30/21 1800     Visit Number 2    Number of Visits 12    Date for PT Re-Evaluation 06/05/21    Authorization Type UHC Medicare/Medicaid    Progress Note Due on Visit 10    PT Start Time 1700    PT Stop Time 1745    PT Time Calculation (min) 45 min    Activity Tolerance Patient tolerated treatment well;Patient limited by pain    Behavior During Therapy [facility] for tasks assessed/performed             Past Medical History:  Diagnosis Date   Cancer ([place])    UTERINE   DDD (degenerative disc disease), lumbar    Osteoarthritis     Past Surgical History:  Procedure Laterality Date   ABDOMINAL HYSTERECTOMY     APPENDECTOMY     BACK SURGERY  2015   HIP SURGERY      There were no vitals filed for this visit.   Subjective Assessment - 04/30/21 1707     Subjective Pt. reports has lost 14 lbs so far.  Has [place] a [place], may move in September.    Pertinent History history uterine cancer, DDD lumbar,  L hip arthroplasty, hysterectomy spinal fusion 2014    Limitations Walking;Standing;Lifting    How long can you sit comfortably? 1 hour    How long can you stand comfortably? 20 min    How long can you walk comfortably? 10 min- limited by hip pain    Diagnostic tests MRI    Patient Stated Goals stretches to help with back pain    Currently in Pain? Yes    Pain Score 4     Pain Location Back    Pain Orientation Right;Left    Pain Type Acute pain;Chronic pain                               [facility] Adult PT Treatment/Exercise - 04/30/21 0001       Exercises   Exercises Lumbar       Lumbar Exercises: Aerobic   Nustep L5 x 6 min      Lumbar Exercises: Standing   Other Standing Lumbar Exercises sit to stands from raised table 2 x 10, trialed squats, increased knee pain.      Lumbar Exercises: Seated   Sit to Stand 20 reps    Sit to Stand Limitations sit to stands from raised table 2 x 10, trialed squats, increased knee pain.    Other Seated Lumbar Exercises leg raises x 10 bil, upper trunk rotation x 10, arm raises x 10      Lumbar Exercises: Sidelying   Clam Right    Clam Limitations incrased pain with SL position, reviewed    Other Sidelying Lumbar Exercises open book x 3      Manual Therapy   Manual Therapy Myofascial release    Manual therapy comments sideling    Myofascial Release with foam roller to R QL, glutes, quad and TFL  PT Education - 04/30/21 1748     Education Details progressed HEP    Person(s) Educated Patient    Methods Explanation;Demonstration;Handout    Comprehension Verbalized understanding;Returned demonstration              PT Short Term Goals - 04/24/21 1436       PT SHORT TERM GOAL #1   Title Pt. to complete FOTO for lumbar spine in 2 visits    Time 2    Period Weeks    Status New    Target Date 05/08/21               PT Long Term Goals - 04/24/21 1437       PT LONG TERM GOAL #1   Title Patient to be independent with advanced HEP.    Time 6    Period Weeks    Status New    Target Date 06/05/21      PT LONG TERM GOAL #2   Title Pt. to report 50% improvement in low back pain.    Time 6    Period Weeks    Status New    Target Date 06/05/21      PT LONG TERM GOAL #3   Title Pt. to demonstrate 4+/5 bil LE strength.    Time 6    Period Weeks    Status New    Target Date 06/05/21                   Plan - 04/30/21 1801     Clinical Impression Statement Pt. reports decreased low back pain today with new exercises.  She is still very painful and limited, but  tolerated sit to stands well over squats.  Tried sidelying exercises but increased pain due to pressure from table.  Also unable to tolerate supine today.  Focused therex on side of preference to decrease pain and improve tolerance.  HEP progressed to address posture as well.  Pt. would benefit from continued skilled therapy.    Personal Factors and Comorbidities Comorbidity 3+    Comorbidities history uterine CA, DDD lumbar spine, history lumbar fusion 2014, L THA, R hip OA    Examination-Activity Limitations Bend;Lift;Squat;Stand;Locomotion Level;Stairs    Examination-Participation Restrictions Driving;Community Activity;Occupation    Stability/Clinical Decision Making Evolving/Moderate complexity    Rehab Potential Fair    PT Frequency 2x / week    PT Duration 6 weeks    PT Treatment/Interventions ADLs/Self Care Home Management;Cryotherapy;Electrical Stimulation;Moist Heat;Gait training;Stair training;Functional mobility training;Therapeutic activities;Therapeutic exercise;Balance training;Neuromuscular re-education;Manual techniques;Passive range of motion;Dry needling;Taping;Spinal Manipulations;Joint Manipulations    PT Next Visit Plan FOTO for lumbar spine, review initial HEP and progress focusing on side of preference, core strengthening.  Modalities PRN    PT Home Exercise Plan Access Code: [email or web address]    Consulted and Agree with Plan of Care Patient             Patient will benefit from skilled therapeutic intervention in order to improve the following deficits and impairments:  Decreased activity tolerance, Decreased endurance, Decreased range of motion, Decreased strength, Decreased mobility, Decreased safety awareness, Difficulty walking, Increased muscle spasms, Impaired flexibility, Postural dysfunction, Pain, Improper body mechanics, Impaired perceived functional ability  Visit Diagnosis: Chronic midline low back pain without sciatica  Abnormal posture  Muscle weakness  (generalized)  Cramp and spasm  Pain in right hip  Acute pain of left shoulder  Stiffness of left shoulder, not elsewhere classified  Other symptoms and signs involving  the musculoskeletal system     Problem List There are no problems to display for this patient.   Rennie Natter PT, DPT 04/30/2021, 6:06 PM  [facility] [address]  [place], [place], [address] Phone: [phone]   Fax:  [phone]  Name: Shannon Delacruz MRN: [id number] Date of Birth: 03/29/1961

## 2021-04-30 NOTE — Patient Instructions (Signed)
Access Code: [email or web address] URL: [email or web address] Date: 04/30/2021 Prepared by: Glenetta Hew  Exercises Mini Squat with Chair - 2 x daily - 7 x weekly - 2 sets - 10 reps Seated Shoulder Rolls - 3 x daily - 7 x weekly - 1 sets - 10 reps Seated Scapular Retraction - 2 x daily - 7 x weekly - 1 sets - 5 reps - 5 hold Clamshell - 1 x daily - 7 x weekly - 3 sets - 10 reps

## 2021-05-01 ENCOUNTER — Ambulatory Visit: Payer: Medicare Other | Admitting: Physical Therapy

## 2021-05-01 ENCOUNTER — Encounter: Payer: Self-pay | Admitting: Physical Therapy

## 2021-05-01 DIAGNOSIS — R29898 Other symptoms and signs involving the musculoskeletal system: Secondary | ICD-10-CM

## 2021-05-01 DIAGNOSIS — M545 Low back pain, unspecified: Secondary | ICD-10-CM | POA: Diagnosis not present

## 2021-05-01 DIAGNOSIS — M25512 Pain in left shoulder: Secondary | ICD-10-CM

## 2021-05-01 DIAGNOSIS — R293 Abnormal posture: Secondary | ICD-10-CM

## 2021-05-01 DIAGNOSIS — M25551 Pain in right hip: Secondary | ICD-10-CM

## 2021-05-01 DIAGNOSIS — M25612 Stiffness of left shoulder, not elsewhere classified: Secondary | ICD-10-CM

## 2021-05-01 DIAGNOSIS — G8929 Other chronic pain: Secondary | ICD-10-CM

## 2021-05-01 DIAGNOSIS — R252 Cramp and spasm: Secondary | ICD-10-CM

## 2021-05-01 DIAGNOSIS — M6281 Muscle weakness (generalized): Secondary | ICD-10-CM

## 2021-05-01 NOTE — Therapy (Signed)
[facility] [address]  [place], [place], [address] Phone: [phone]   Fax:  [phone]  Physical Therapy Treatment  Patient Details  Name: Shannon Delacruz MRN: [id number] Date of Birth: 09/08/1961 Referring Provider (PT): Suella Broad   Encounter Date: 05/01/2021   PT End of Session - 05/01/21 1804     Visit Number 3    Number of Visits 12    Date for PT Re-Evaluation 06/05/21    Authorization Type UHC Medicare/Medicaid    Progress Note Due on Visit 10    PT Start Time 1700    PT Stop Time 1745    PT Time Calculation (min) 45 min    Activity Tolerance Patient tolerated treatment well;Patient limited by pain    Behavior During Therapy [facility] for tasks assessed/performed             Past Medical History:  Diagnosis Date   Cancer ([place])    UTERINE   DDD (degenerative disc disease), lumbar    Osteoarthritis     Past Surgical History:  Procedure Laterality Date   ABDOMINAL HYSTERECTOMY     APPENDECTOMY     BACK SURGERY  2015   HIP SURGERY      There were no vitals filed for this visit.   Subjective Assessment - 05/01/21 1703     Subjective Sore today, had to stay out of house all day.   Slept in today, thinks sleeping better as pain improving.    Pertinent History history uterine cancer, DDD lumbar,  L hip arthroplasty, hysterectomy spinal fusion 2014    Limitations Walking;Standing;Lifting    How long can you sit comfortably? 1 hour    How long can you stand comfortably? 20 min    How long can you walk comfortably? 10 min- limited by hip pain    Diagnostic tests MRI    Patient Stated Goals stretches to help with back pain    Currently in Pain? Yes    Pain Score 5     Pain Location Back    Pain Descriptors / Indicators Aching;Sore    Pain Type Chronic pain                               [facility] Adult PT Treatment/Exercise - 05/01/21 0001       Exercises   Exercises  Lumbar      Lumbar Exercises: Aerobic   Nustep L4 x 8 min      Manual Therapy   Manual Therapy Myofascial release    Manual therapy comments in prone    Myofascial Release gentle myofascial release to bil gluts, QL, and lumbar paraspinals gradually increasing pressure.                    PT Education - 05/01/21 1804     Education Details educated on pain science throughout session    Person(s) Educated Patient    Methods Explanation    Comprehension Verbalized understanding              PT Short Term Goals - 04/24/21 1436       PT SHORT TERM GOAL #1   Title Pt. to complete FOTO for lumbar spine in 2 visits    Time 2    Period Weeks    Status New    Target Date 05/08/21  PT Long Term Goals - 04/24/21 1437       PT LONG TERM GOAL #1   Title Patient to be independent with advanced HEP.    Time 6    Period Weeks    Status New    Target Date 06/05/21      PT LONG TERM GOAL #2   Title Pt. to report 50% improvement in low back pain.    Time 6    Period Weeks    Status New    Target Date 06/05/21      PT LONG TERM GOAL #3   Title Pt. to demonstrate 4+/5 bil LE strength.    Time 6    Period Weeks    Status New    Target Date 06/05/21                   Plan - 05/01/21 1804     Clinical Impression Statement Patient reports increased soreness, but also has a lot of stress currently with move.  She did perform new HEP this morning and finds helpful.  Focus of session today with manual therapy to decrease pain, trialed dry needling but due to hyperanalgesia not able to tolerate insertion of needed past dermis so discontinued.  Instead was able to tolerate myofascial release to lumbar/gluteal region, but had muscle spasm when transfering back to seated position.  Educated throughout session on pain neuroscience.  Pt. would benefit from continued skilled therapy.    Personal Factors and Comorbidities Comorbidity 3+     Comorbidities history uterine CA, DDD lumbar spine, history lumbar fusion 2014, L THA, R hip OA    Examination-Activity Limitations Bend;Lift;Squat;Stand;Locomotion Level;Stairs    Examination-Participation Restrictions Driving;Community Activity;Occupation    Stability/Clinical Decision Making Evolving/Moderate complexity    Rehab Potential Fair    PT Frequency 2x / week    PT Duration 6 weeks    PT Treatment/Interventions ADLs/Self Care Home Management;Cryotherapy;Electrical Stimulation;Moist Heat;Gait training;Stair training;Functional mobility training;Therapeutic activities;Therapeutic exercise;Balance training;Neuromuscular re-education;Manual techniques;Passive range of motion;Dry needling;Taping;Spinal Manipulations;Joint Manipulations    PT Next Visit Plan FOTO for lumbar spine, review initial HEP and progress focusing on side of preference, core strengthening.  Modalities PRN    PT Home Exercise Plan Access Code: [email or web address]    Consulted and Agree with Plan of Care Patient             Patient will benefit from skilled therapeutic intervention in order to improve the following deficits and impairments:  Decreased activity tolerance, Decreased endurance, Decreased range of motion, Decreased strength, Decreased mobility, Decreased safety awareness, Difficulty walking, Increased muscle spasms, Impaired flexibility, Postural dysfunction, Pain, Improper body mechanics, Impaired perceived functional ability  Visit Diagnosis: Chronic midline low back pain without sciatica  Abnormal posture  Muscle weakness (generalized)  Cramp and spasm  Pain in right hip  Acute pain of left shoulder  Stiffness of left shoulder, not elsewhere classified  Other symptoms and signs involving the musculoskeletal system     Problem List There are no problems to display for this patient.   Rennie Natter PT, DPT 05/01/2021, 6:09 PM  [facility] [address]  [place], [place], [address] Phone: [phone]   Fax:  [phone]  Name: Dametria Gressman MRN: [id number] Date of Birth: Jun 30, 1961

## 2021-05-15 ENCOUNTER — Ambulatory Visit: Payer: Medicare Other | Attending: Physical Medicine and Rehabilitation | Admitting: Physical Therapy

## 2021-05-15 ENCOUNTER — Other Ambulatory Visit: Payer: Self-pay

## 2021-05-15 ENCOUNTER — Encounter: Payer: Self-pay | Admitting: Physical Therapy

## 2021-05-15 DIAGNOSIS — M6281 Muscle weakness (generalized): Secondary | ICD-10-CM | POA: Diagnosis present

## 2021-05-15 DIAGNOSIS — M545 Low back pain, unspecified: Secondary | ICD-10-CM | POA: Diagnosis present

## 2021-05-15 DIAGNOSIS — R252 Cramp and spasm: Secondary | ICD-10-CM | POA: Insufficient documentation

## 2021-05-15 DIAGNOSIS — R293 Abnormal posture: Secondary | ICD-10-CM | POA: Insufficient documentation

## 2021-05-15 DIAGNOSIS — G8929 Other chronic pain: Secondary | ICD-10-CM | POA: Insufficient documentation

## 2021-05-15 DIAGNOSIS — M25551 Pain in right hip: Secondary | ICD-10-CM | POA: Diagnosis present

## 2021-05-15 NOTE — Patient Instructions (Signed)
Access Code: [email or web address] URL: [email or web address] Date: 05/15/2021 Prepared by: Glenetta Hew  Exercises Standing Hip Abduction with Counter Support - 1 x daily - 7 x weekly - 2 sets - 10 reps Standing Hip Extension with Counter Support - 1 x daily - 7 x weekly - 2 sets - 10 reps Standing Hip Flexion with Counter Support - 1 x daily - 7 x weekly - 2 sets - 10 reps Heel Raises with Counter Support - 1 x daily - 7 x weekly - 2 sets - 10 reps Sit to Stand with Counter Support - 1 x daily - 7 x weekly - 2 sets - 10 reps Standing Knee Flexion with Counter Support - 1 x daily - 7 x weekly - 2 sets - 10 reps Clamshell - 1 x daily - 7 x weekly - 2 sets - 10 reps

## 2021-05-15 NOTE — Therapy (Signed)
[facility] [address]  [place], [place], [address] Phone: [phone]   Fax:  [phone]  Physical Therapy Discharge  PHYSICAL THERAPY DISCHARGE SUMMARY  Visits from Start of Care: 4  Current functional level related to goals / functional outcomes: Improved LE strength, decreased back pain, improve ability to exercise   Remaining deficits: pain   Education / Equipment: HEP  Plan: Patient agrees to discharge. Patient is being discharged due to meeting the stated rehab goals and moving out of area.      Patient Details  Name: Shannon Delacruz MRN: [id number] Date of Birth: 27-Jul-1961 Referring Provider (PT): Suella Broad   Encounter Date: 05/15/2021   PT End of Session - 05/15/21 1623     Visit Number 4    Number of Visits 12    Date for PT Re-Evaluation 06/05/21    Authorization Type UHC Medicare/Medicaid    Progress Note Due on Visit 10    PT Start Time 0332    PT Stop Time 0417    PT Time Calculation (min) 45 min    Activity Tolerance Patient tolerated treatment well;Patient limited by pain    Behavior During Therapy [facility] for tasks assessed/performed             Past Medical History:  Diagnosis Date   Cancer ([place])    UTERINE   DDD (degenerative disc disease), lumbar    Osteoarthritis     Past Surgical History:  Procedure Laterality Date   ABDOMINAL HYSTERECTOMY     APPENDECTOMY     BACK SURGERY  2015   HIP SURGERY      There were no vitals filed for this visit.   Subjective Assessment - 05/15/21 1533     Subjective Patient is sore and tired from packing all day, moving tomorrow.  Has lost 16.9 lbs so far.  Had to cancel all her visits but PT has helped.    Pertinent History history uterine cancer, DDD lumbar,  L hip arthroplasty, hysterectomy spinal fusion 2014    Limitations Walking;Standing;Lifting    How long can you sit comfortably? 1 hour    How long can you stand  comfortably? 20 min    How long can you walk comfortably? 10 min- limited by hip pain    Diagnostic tests MRI    Patient Stated Goals stretches to help with back pain    Currently in Pain? Yes    Pain Score 6     Pain Location Back    Pain Descriptors / Indicators Aching;Sore    Pain Type Chronic pain                [facility] PT Assessment - 05/15/21 0001       Assessment   Medical Diagnosis postlaminectomy syndrome    Referring Provider (PT) Nelva Bush, Richard    Hand Dominance Right    Next MD Visit none scheduled    Prior Therapy yes      Precautions   Precautions Other (comment)      Restrictions   Weight Bearing Restrictions No      Strength   Overall Strength Within functional limits for tasks performed    Strength Assessment Site Hip;Knee;Ankle    Right/Left Hip Right;Left    Right Hip Flexion 4+/5    Right Hip ABduction 4+/5    Right Hip ADduction 5/5    Left Hip Flexion 4+/5    Left Hip ABduction 4+/5  Left Hip ADduction 5/5    Right/Left Knee Right;Left    Right Knee Flexion 4+/5    Right Knee Extension 4+/5    Left Knee Flexion 4+/5    Left Knee Extension 4+/5    Right/Left Ankle Right;Left    Right Ankle Dorsiflexion 4+/5    Left Ankle Dorsiflexion 4+/5                           [facility] Adult PT Treatment/Exercise - 05/15/21 0001       Exercises   Exercises Knee/Hip      Lumbar Exercises: Aerobic   Nustep L6 x 8 min      Lumbar Exercises: Standing   Heel Raises 10 reps    Other Standing Lumbar Exercises sit to stands from raised table 2 x 10      Lumbar Exercises: Sidelying   Clam Limitations reviewed      Knee/Hip Exercises: Standing   Knee Flexion Strengthening;Both;2 sets;10 reps    Knee Flexion Limitations at counter for support    Hip Flexion Stengthening;Both;2 sets;10 reps    Hip Flexion Limitations at counter for support    Hip Abduction Stengthening;Both;2 sets;10 reps    Abduction Limitations at counter for  support      Manual Therapy   Manual Therapy Myofascial release    Manual therapy comments in sidelying    Myofascial Release gentle myofascial release to L gluts, QL, and lumbar paraspinals               PT Education - 05/15/21 1637     Education Details Progressed HEP with standing hip strengthening exercises.  Access Code: [email or web address]    Person(s) Educated Patient    Methods Explanation;Demonstration;Verbal cues;Handout    Comprehension Verbalized understanding;Returned demonstration              PT Short Term Goals - 05/15/21 1538       PT SHORT TERM GOAL #1   Title Pt. to complete FOTO for lumbar spine in 2 visits    Time 2    Period Weeks    Status Not Met    Target Date 05/08/21               PT Long Term Goals - 05/15/21 1539       PT LONG TERM GOAL #1   Title Patient to be independent with advanced HEP.    Time 6    Period Weeks    Status Achieved   met for current     PT LONG TERM GOAL #2   Title Pt. to report 50% improvement in low back pain.    Time 6    Period Weeks    Status Achieved   50% improvement overall, pain primarily from packing and moving     PT LONG TERM GOAL #3   Title Pt. to demonstrate 4+/5 bil LE strength.    Time 6    Period Weeks    Status Achieved                   Plan - 05/15/21 1624     Clinical Impression Statement Patient reports that she feels PT has helped her significantly, but is moving to [place], so discharging today.  Focus of session was progressing HEP to continue strengthening at home, focusing on hip strengthening.  She was able to perform all exercises but fatigued very quickly, especially on L  hip (history of L THA).  Educated to perform small sets (5 reps) and build endurance to 10 reps.  Also demonstrated self mob upper thoracic spine in chair, self STM of quads/ITB with massage roller, followed by brief manual therapy to L low back to decrease pain.  Patient has met all of her LTG,  is consistent and compliant with HEP, and ready for discharge.    Personal Factors and Comorbidities Comorbidity 3+    Comorbidities history uterine CA, DDD lumbar spine, history lumbar fusion 2014, L THA, R hip OA    Examination-Activity Limitations Bend;Lift;Squat;Stand;Locomotion Level;Stairs    Examination-Participation Restrictions Driving;Community Activity;Occupation    Stability/Clinical Decision Making Evolving/Moderate complexity    Rehab Potential Fair    PT Frequency 2x / week    PT Duration 6 weeks    PT Treatment/Interventions ADLs/Self Care Home Management;Cryotherapy;Electrical Stimulation;Moist Heat;Gait training;Stair training;Functional mobility training;Therapeutic activities;Therapeutic exercise;Balance training;Neuromuscular re-education;Manual techniques;Passive range of motion;Dry needling;Taping;Spinal Manipulations;Joint Manipulations    PT Next Visit Plan --    PT Home Exercise Plan Access Code: [email or web address]    Consulted and Agree with Plan of Care Patient             Patient will benefit from skilled therapeutic intervention in order to improve the following deficits and impairments:  Decreased activity tolerance, Decreased endurance, Decreased range of motion, Decreased strength, Decreased mobility, Decreased safety awareness, Difficulty walking, Increased muscle spasms, Impaired flexibility, Postural dysfunction, Pain, Improper body mechanics, Impaired perceived functional ability  Visit Diagnosis: Chronic midline low back pain without sciatica  Abnormal posture  Muscle weakness (generalized)  Cramp and spasm  Pain in right hip     Problem List There are no problems to display for this patient.   Rennie Natter, PT DPT 05/15/2021, 4:42 PM  [facility] [address]  [place], [place], [address] Phone: [phone]   Fax:  [phone]  Name: Sheliah Fiorillo MRN: [id number] Date of  Birth: [birth year]

## 2021-05-16 ENCOUNTER — Encounter: Payer: Medicare Other | Admitting: Physical Therapy

## 2022-01-28 LAB — EXTERNAL GENERIC LAB PROCEDURE: COLOGUARD: NEGATIVE

## 2022-01-28 LAB — COLOGUARD: COLOGUARD: NEGATIVE

## 2023-08-13 ENCOUNTER — Encounter (INDEPENDENT_AMBULATORY_CARE_PROVIDER_SITE_OTHER): Payer: Self-pay | Admitting: *Deleted

## 2024-02-11 ENCOUNTER — Encounter (INDEPENDENT_AMBULATORY_CARE_PROVIDER_SITE_OTHER): Payer: Self-pay | Admitting: *Deleted
